# Patient Record
Sex: Female | Born: 1955 | Race: White | Hispanic: No | Smoking: Never smoker
Health system: Southern US, Community
[De-identification: ages and names within clinical notes are randomized; demographics above are authoritative.]

## PROBLEM LIST (undated history)

## (undated) DIAGNOSIS — C3490 Malignant neoplasm of unspecified part of unspecified bronchus or lung: Secondary | ICD-10-CM

## (undated) DIAGNOSIS — C801 Malignant (primary) neoplasm, unspecified: Secondary | ICD-10-CM

---

## 2021-07-12 ENCOUNTER — Encounter (HOSPITAL_COMMUNITY): Payer: Self-pay | Admitting: Emergency Medicine

## 2021-07-12 ENCOUNTER — Inpatient Hospital Stay (HOSPITAL_COMMUNITY)
Admission: EM | Admit: 2021-07-12 | Discharge: 2021-07-24 | DRG: 871 | Disposition: A | Discharge status: Home Health | Payer: Medicare PPO | Attending: Internal Medicine | Admitting: Internal Medicine

## 2021-07-12 ENCOUNTER — Emergency Department (HOSPITAL_COMMUNITY): Payer: Medicare PPO

## 2021-07-12 ENCOUNTER — Other Ambulatory Visit: Payer: Self-pay

## 2021-07-12 DIAGNOSIS — Z90710 Acquired absence of both cervix and uterus: Secondary | ICD-10-CM

## 2021-07-12 DIAGNOSIS — T451X5A Adverse effect of antineoplastic and immunosuppressive drugs, initial encounter: Secondary | ICD-10-CM | POA: Diagnosis present

## 2021-07-12 DIAGNOSIS — Y842 Radiological procedure and radiotherapy as the cause of abnormal reaction of the patient, or of later complication, without mention of misadventure at the time of the procedure: Secondary | ICD-10-CM | POA: Diagnosis present

## 2021-07-12 DIAGNOSIS — J189 Pneumonia, unspecified organism: Secondary | ICD-10-CM | POA: Diagnosis present

## 2021-07-12 DIAGNOSIS — R627 Adult failure to thrive: Secondary | ICD-10-CM | POA: Diagnosis present

## 2021-07-12 DIAGNOSIS — C7951 Secondary malignant neoplasm of bone: Secondary | ICD-10-CM | POA: Diagnosis present

## 2021-07-12 DIAGNOSIS — K449 Diaphragmatic hernia without obstruction or gangrene: Secondary | ICD-10-CM | POA: Diagnosis present

## 2021-07-12 DIAGNOSIS — R0603 Acute respiratory distress: Secondary | ICD-10-CM

## 2021-07-12 DIAGNOSIS — J811 Chronic pulmonary edema: Secondary | ICD-10-CM | POA: Diagnosis present

## 2021-07-12 DIAGNOSIS — R0602 Shortness of breath: Secondary | ICD-10-CM | POA: Diagnosis not present

## 2021-07-12 DIAGNOSIS — R64 Cachexia: Secondary | ICD-10-CM | POA: Diagnosis present

## 2021-07-12 DIAGNOSIS — J810 Acute pulmonary edema: Secondary | ICD-10-CM | POA: Diagnosis present

## 2021-07-12 DIAGNOSIS — R000 Tachycardia, unspecified: Secondary | ICD-10-CM

## 2021-07-12 DIAGNOSIS — E119 Type 2 diabetes mellitus without complications: Secondary | ICD-10-CM | POA: Diagnosis present

## 2021-07-12 DIAGNOSIS — D630 Anemia in neoplastic disease: Secondary | ICD-10-CM | POA: Diagnosis present

## 2021-07-12 DIAGNOSIS — J441 Chronic obstructive pulmonary disease with (acute) exacerbation: Secondary | ICD-10-CM | POA: Diagnosis present

## 2021-07-12 DIAGNOSIS — J8410 Pulmonary fibrosis, unspecified: Secondary | ICD-10-CM | POA: Diagnosis present

## 2021-07-12 DIAGNOSIS — D6481 Anemia due to antineoplastic chemotherapy: Secondary | ICD-10-CM | POA: Diagnosis present

## 2021-07-12 DIAGNOSIS — Z9981 Dependence on supplemental oxygen: Secondary | ICD-10-CM

## 2021-07-12 DIAGNOSIS — I10 Essential (primary) hypertension: Secondary | ICD-10-CM | POA: Diagnosis present

## 2021-07-12 DIAGNOSIS — J9621 Acute and chronic respiratory failure with hypoxia: Secondary | ICD-10-CM | POA: Diagnosis present

## 2021-07-12 DIAGNOSIS — J440 Chronic obstructive pulmonary disease with acute lower respiratory infection: Secondary | ICD-10-CM | POA: Diagnosis present

## 2021-07-12 DIAGNOSIS — Z882 Allergy status to sulfonamides status: Secondary | ICD-10-CM

## 2021-07-12 DIAGNOSIS — Z20822 Contact with and (suspected) exposure to covid-19: Secondary | ICD-10-CM | POA: Diagnosis present

## 2021-07-12 DIAGNOSIS — E43 Unspecified severe protein-calorie malnutrition: Secondary | ICD-10-CM | POA: Diagnosis present

## 2021-07-12 DIAGNOSIS — E876 Hypokalemia: Secondary | ICD-10-CM | POA: Diagnosis present

## 2021-07-12 DIAGNOSIS — J90 Pleural effusion, not elsewhere classified: Secondary | ICD-10-CM | POA: Diagnosis present

## 2021-07-12 DIAGNOSIS — T50995A Adverse effect of other drugs, medicaments and biological substances, initial encounter: Secondary | ICD-10-CM | POA: Diagnosis present

## 2021-07-12 DIAGNOSIS — M8448XA Pathological fracture, other site, initial encounter for fracture: Secondary | ICD-10-CM | POA: Diagnosis present

## 2021-07-12 DIAGNOSIS — Z79899 Other long term (current) drug therapy: Secondary | ICD-10-CM

## 2021-07-12 DIAGNOSIS — R0982 Postnasal drip: Secondary | ICD-10-CM | POA: Diagnosis present

## 2021-07-12 DIAGNOSIS — Z9889 Other specified postprocedural states: Secondary | ICD-10-CM | POA: Diagnosis not present

## 2021-07-12 DIAGNOSIS — D849 Immunodeficiency, unspecified: Secondary | ICD-10-CM | POA: Diagnosis present

## 2021-07-12 DIAGNOSIS — Z923 Personal history of irradiation: Secondary | ICD-10-CM | POA: Diagnosis not present

## 2021-07-12 DIAGNOSIS — R0600 Dyspnea, unspecified: Secondary | ICD-10-CM

## 2021-07-12 DIAGNOSIS — K5909 Other constipation: Secondary | ICD-10-CM | POA: Diagnosis present

## 2021-07-12 DIAGNOSIS — C7931 Secondary malignant neoplasm of brain: Secondary | ICD-10-CM | POA: Diagnosis present

## 2021-07-12 DIAGNOSIS — R6520 Severe sepsis without septic shock: Secondary | ICD-10-CM | POA: Diagnosis not present

## 2021-07-12 DIAGNOSIS — G893 Neoplasm related pain (acute) (chronic): Secondary | ICD-10-CM | POA: Diagnosis present

## 2021-07-12 DIAGNOSIS — F32A Depression, unspecified: Secondary | ICD-10-CM | POA: Diagnosis present

## 2021-07-12 DIAGNOSIS — E872 Acidosis: Secondary | ICD-10-CM | POA: Diagnosis present

## 2021-07-12 DIAGNOSIS — Z6826 Body mass index (BMI) 26.0-26.9, adult: Secondary | ICD-10-CM

## 2021-07-12 DIAGNOSIS — A419 Sepsis, unspecified organism: Secondary | ICD-10-CM | POA: Diagnosis not present

## 2021-07-12 DIAGNOSIS — J9601 Acute respiratory failure with hypoxia: Secondary | ICD-10-CM

## 2021-07-12 DIAGNOSIS — C3490 Malignant neoplasm of unspecified part of unspecified bronchus or lung: Secondary | ICD-10-CM | POA: Diagnosis present

## 2021-07-12 DIAGNOSIS — R509 Fever, unspecified: Secondary | ICD-10-CM

## 2021-07-12 DIAGNOSIS — Z885 Allergy status to narcotic agent status: Secondary | ICD-10-CM

## 2021-07-12 DIAGNOSIS — Y92009 Unspecified place in unspecified non-institutional (private) residence as the place of occurrence of the external cause: Secondary | ICD-10-CM | POA: Diagnosis not present

## 2021-07-12 HISTORY — DX: Malignant (primary) neoplasm, unspecified: C80.1

## 2021-07-12 HISTORY — DX: Malignant neoplasm of unspecified part of unspecified bronchus or lung: C34.90

## 2021-07-12 LAB — COMPREHENSIVE METABOLIC PANEL
ALT: 10 U/L (ref 0–44)
AST: 15 U/L (ref 15–41)
Albumin: 2.6 g/dL — ABNORMAL LOW (ref 3.5–5.0)
Alkaline Phosphatase: 67 U/L (ref 38–126)
Anion gap: 8 (ref 5–15)
BUN: 15 mg/dL (ref 8–23)
CO2: 28 mmol/L (ref 22–32)
Calcium: 9.6 mg/dL (ref 8.9–10.3)
Chloride: 101 mmol/L (ref 98–111)
Creatinine, Ser: 1 mg/dL (ref 0.44–1.00)
GFR, Estimated: >60 mL/min (ref >60)
Glucose, Bld: 168 mg/dL — ABNORMAL HIGH (ref 70–99)
Potassium: 3.6 mmol/L (ref 3.5–5.1)
Sodium: 137 mmol/L (ref 135–145)
Total Bilirubin: 0.3 mg/dL (ref 0.3–1.2)
Total Protein: 5.4 g/dL — ABNORMAL LOW (ref 6.5–8.1)

## 2021-07-12 LAB — CBC WITH DIFFERENTIAL/PLATELET
Abs Immature Granulocytes: 0.55 10*3/uL — ABNORMAL HIGH (ref 0.00–0.07)
Basophils Absolute: 0.1 10*3/uL (ref 0.0–0.1)
Basophils Relative: 0 %
Eosinophils Absolute: 0 10*3/uL (ref 0.0–0.5)
Eosinophils Relative: 0 %
HCT: 31.2 % — ABNORMAL LOW (ref 36.0–46.0)
Hemoglobin: 9.8 g/dL — ABNORMAL LOW (ref 12.0–15.0)
Immature Granulocytes: 3 %
Lymphocytes Relative: 2 %
Lymphs Abs: 0.4 10*3/uL — ABNORMAL LOW (ref 0.7–4.0)
MCH: 30.7 pg (ref 26.0–34.0)
MCHC: 31.4 g/dL (ref 30.0–36.0)
MCV: 97.8 fL (ref 80.0–100.0)
Monocytes Absolute: 0.9 10*3/uL (ref 0.1–1.0)
Monocytes Relative: 5 %
Neutro Abs: 16.5 10*3/uL — ABNORMAL HIGH (ref 1.7–7.7)
Neutrophils Relative %: 90 %
Platelets: 173 10*3/uL (ref 150–400)
RBC: 3.19 MIL/uL — ABNORMAL LOW (ref 3.87–5.11)
RDW: 16.1 % — ABNORMAL HIGH (ref 11.5–15.5)
WBC: 18.5 10*3/uL — ABNORMAL HIGH (ref 4.0–10.5)
nRBC: 0 % (ref 0.0–0.2)

## 2021-07-12 LAB — URINALYSIS, ROUTINE W REFLEX MICROSCOPIC
Bilirubin Urine: NEGATIVE
Glucose, UA: NEGATIVE mg/dL
Hgb urine dipstick: NEGATIVE
Ketones, ur: NEGATIVE mg/dL
Leukocytes,Ua: NEGATIVE
Nitrite: NEGATIVE
Protein, ur: NEGATIVE mg/dL
Specific Gravity, Urine: <1.005 — ABNORMAL LOW (ref 1.005–1.030)
pH: 6 (ref 5.0–8.0)

## 2021-07-12 LAB — APTT: aPTT: 98 seconds — ABNORMAL HIGH (ref 24–36)

## 2021-07-12 LAB — RESP PANEL BY RT-PCR (FLU A&B, COVID) ARPGX2
Influenza A by PCR: NEGATIVE
Influenza B by PCR: NEGATIVE
SARS Coronavirus 2 by RT PCR: NEGATIVE

## 2021-07-12 LAB — CBG MONITORING, ED
Glucose-Capillary: 133 mg/dL — ABNORMAL HIGH (ref 70–99)
Glucose-Capillary: 85 mg/dL (ref 70–99)

## 2021-07-12 LAB — PROTIME-INR
INR: 1.1 (ref 0.8–1.2)
Prothrombin Time: 13.9 seconds (ref 11.4–15.2)

## 2021-07-12 LAB — TROPONIN I (HIGH SENSITIVITY)
Troponin I (High Sensitivity): 7 ng/L (ref <18)
Troponin I (High Sensitivity): 8 ng/L (ref <18)

## 2021-07-12 LAB — LACTIC ACID, PLASMA
Lactic Acid, Venous: 2.8 mmol/L (ref 0.5–1.9)
Lactic Acid, Venous: 2.6 mmol/L (ref 0.5–1.9)

## 2021-07-12 LAB — GLUCOSE, CAPILLARY: Glucose-Capillary: 106 mg/dL — ABNORMAL HIGH (ref 70–99)

## 2021-07-12 MED ORDER — SODIUM CHLORIDE 0.9 % IV BOLUS
500.0000 mL | Freq: Once | INTRAVENOUS | Status: AC
  Administered 2021-07-12: 500 mL via INTRAVENOUS

## 2021-07-12 MED ORDER — IPRATROPIUM-ALBUTEROL 0.5-2.5 (3) MG/3ML IN SOLN
3.0000 mL | Freq: Four times a day (QID) | RESPIRATORY_TRACT | Status: DC | PRN
  Administered 2021-07-13 (×2): 3 mL via RESPIRATORY_TRACT
  Filled 2021-07-12 (×2): qty 3

## 2021-07-12 MED ORDER — CHLORHEXIDINE GLUCONATE CLOTH 2 % EX PADS
6.0000 | MEDICATED_PAD | Freq: Every day | CUTANEOUS | Status: DC
  Administered 2021-07-13 – 2021-07-24 (×12): 6 via TOPICAL

## 2021-07-12 MED ORDER — INSULIN ASPART 100 UNIT/ML IJ SOLN
0.0000 [IU] | Freq: Three times a day (TID) | INTRAMUSCULAR | Status: DC
  Administered 2021-07-14 – 2021-07-20 (×5): 1 [IU] via SUBCUTANEOUS
  Administered 2021-07-21: 3 [IU] via SUBCUTANEOUS
  Administered 2021-07-21: 2 [IU] via SUBCUTANEOUS
  Administered 2021-07-22 – 2021-07-23 (×3): 1 [IU] via SUBCUTANEOUS
  Administered 2021-07-23: 2 [IU] via SUBCUTANEOUS
  Filled 2021-07-12: qty 0.06

## 2021-07-12 MED ORDER — SODIUM CHLORIDE 0.9 % IV SOLN: INTRAVENOUS | Status: DC

## 2021-07-12 MED ORDER — ACETAMINOPHEN 325 MG PO TABS
650.0000 mg | ORAL_TABLET | Freq: Four times a day (QID) | ORAL | Status: DC | PRN
  Administered 2021-07-18 – 2021-07-21 (×3): 650 mg via ORAL
  Filled 2021-07-12 (×3): qty 2

## 2021-07-12 MED ORDER — ONDANSETRON HCL 4 MG PO TABS
4.0000 mg | ORAL_TABLET | Freq: Four times a day (QID) | ORAL | Status: DC | PRN
  Administered 2021-07-22: 4 mg via ORAL
  Filled 2021-07-12: qty 1

## 2021-07-12 MED ORDER — ACETAMINOPHEN 500 MG PO TABS
1000.0000 mg | ORAL_TABLET | Freq: Once | ORAL | Status: AC
  Administered 2021-07-12: 1000 mg via ORAL
  Filled 2021-07-12: qty 2

## 2021-07-12 MED ORDER — CALCIUM CARBONATE ANTACID 500 MG PO CHEW
1.0000 | CHEWABLE_TABLET | Freq: Three times a day (TID) | ORAL | Status: DC
  Administered 2021-07-12 – 2021-07-24 (×30): 200 mg via ORAL
  Filled 2021-07-12 (×33): qty 1

## 2021-07-12 MED ORDER — ONDANSETRON HCL 4 MG/2ML IJ SOLN
4.0000 mg | Freq: Four times a day (QID) | INTRAMUSCULAR | Status: DC | PRN
  Administered 2021-07-12 – 2021-07-22 (×8): 4 mg via INTRAVENOUS
  Filled 2021-07-12 (×8): qty 2

## 2021-07-12 MED ORDER — HYDROMORPHONE HCL 1 MG/ML IJ SOLN
1.0000 mg | Freq: Once | INTRAMUSCULAR | Status: AC
  Administered 2021-07-12: 1 mg via INTRAVENOUS
  Filled 2021-07-12: qty 1

## 2021-07-12 MED ORDER — LACTATED RINGERS IV BOLUS
500.0000 mL | Freq: Once | INTRAVENOUS | Status: AC
  Administered 2021-07-12: 500 mL via INTRAVENOUS

## 2021-07-12 MED ORDER — ACETAMINOPHEN 650 MG RE SUPP: 650.0000 mg | Freq: Four times a day (QID) | RECTAL | Status: DC | PRN

## 2021-07-12 MED ORDER — PANTOPRAZOLE SODIUM 40 MG PO TBEC
40.0000 mg | DELAYED_RELEASE_TABLET | Freq: Every day | ORAL | Status: DC
  Administered 2021-07-12 – 2021-07-24 (×13): 40 mg via ORAL
  Filled 2021-07-12 (×13): qty 1

## 2021-07-12 MED ORDER — FENTANYL CITRATE PF 50 MCG/ML IJ SOSY: 50.0000 ug | PREFILLED_SYRINGE | Freq: Once | INTRAMUSCULAR | Status: DC

## 2021-07-12 MED ORDER — MORPHINE SULFATE (PF) 4 MG/ML IV SOLN
4.0000 mg | Freq: Once | INTRAVENOUS | Status: AC
Start: 2021-07-12 — End: 2021-07-12
  Administered 2021-07-12: 4 mg via INTRAVENOUS
  Filled 2021-07-12: qty 1

## 2021-07-12 MED ORDER — SODIUM CHLORIDE 0.9 % IV SOLN
2.0000 g | Freq: Once | INTRAVENOUS | Status: AC
  Administered 2021-07-12: 2 g via INTRAVENOUS
  Filled 2021-07-12: qty 2

## 2021-07-12 MED ORDER — SODIUM CHLORIDE 0.9 % IV SOLN: 2.0000 g | Freq: Once | INTRAVENOUS | Status: DC

## 2021-07-12 MED ORDER — GUAIFENESIN ER 600 MG PO TB12
600.0000 mg | ORAL_TABLET | Freq: Two times a day (BID) | ORAL | Status: DC
  Administered 2021-07-12 – 2021-07-23 (×24): 600 mg via ORAL
  Filled 2021-07-12 (×25): qty 1

## 2021-07-12 MED ORDER — SODIUM CHLORIDE 0.9% FLUSH
10.0000 mL | INTRAVENOUS | Status: DC | PRN
  Administered 2021-07-19: 10 mL

## 2021-07-12 MED ORDER — LIDOCAINE-PRILOCAINE 2.5-2.5 % EX CREA
TOPICAL_CREAM | Freq: Once | CUTANEOUS | Status: AC
  Filled 2021-07-12: qty 5

## 2021-07-12 MED ORDER — IOHEXOL 350 MG/ML SOLN
75.0000 mL | Freq: Once | INTRAVENOUS | Status: AC | PRN
  Administered 2021-07-12: 75 mL via INTRAVENOUS

## 2021-07-12 MED ORDER — ONDANSETRON HCL 4 MG/2ML IJ SOLN
4.0000 mg | Freq: Once | INTRAMUSCULAR | Status: AC
  Administered 2021-07-12: 4 mg via INTRAVENOUS
  Filled 2021-07-12: qty 2

## 2021-07-12 MED ORDER — SODIUM CHLORIDE 0.9 % IV SOLN
2.0000 g | Freq: Two times a day (BID) | INTRAVENOUS | Status: DC
  Administered 2021-07-12 – 2021-07-14 (×4): 2 g via INTRAVENOUS
  Filled 2021-07-12 (×5): qty 2

## 2021-07-12 MED ORDER — LACTATED RINGERS IV BOLUS
1000.0000 mL | Freq: Once | INTRAVENOUS | Status: AC
  Administered 2021-07-12: 1000 mL via INTRAVENOUS

## 2021-07-12 MED ORDER — KETOROLAC TROMETHAMINE 15 MG/ML IJ SOLN
15.0000 mg | Freq: Four times a day (QID) | INTRAMUSCULAR | Status: AC | PRN
  Administered 2021-07-12 – 2021-07-15 (×4): 15 mg via INTRAVENOUS
  Filled 2021-07-12 (×4): qty 1

## 2021-07-12 NOTE — Progress Notes (Signed)
Pharmacy Antibiotic Note  Kelli Acosta is a 65 y.o. female admitted on 07/12/2021 with increasing ShOB over several days with new chills/fever.  Pharmacy has been consulted for Cefepime dosing.  Afeb, WBC 18.5, SCr 1.0 Chemo 8/29, PAC noted  Plan: Cefepime 2gm q12 for tx of PNA  Height: 5\' 5"  (165.1 cm) Weight: 71.2 kg (157 lb) IBW/kg (Calculated) : 57  Temp (24hrs), Avg:98.5 F (36.9 C), Min:98.4 F (36.9 C), Max:98.5 F (36.9 C)  Recent Labs  Lab 07/12/21 0628  WBC 18.5*  CREATININE 1.00  LATICACIDVEN 2.8*    Estimated Creatinine Clearance: 56.3 mL/min (by C-G formula based on SCr of 1 mg/dL).    Allergies  Allergen Reactions   Codeine Hives and Itching   Sulfa Antibiotics     Antimicrobials this admission: 9/2 Cefepime >>   Dose adjustments this admission:  Microbiology results: 9/2 BCx: sent HIV Ab ordered  Thank you for allowing pharmacy to be a part of this patient's care.  Minda Ditto PharmD 07/12/2021 10:35 AM

## 2021-07-12 NOTE — ED Provider Notes (Signed)
Assumed care of patient at 7 AM.  Here with fever.  History of lung cancer currently on chemotherapy.  Last chemo infusion about a week ago.  Has had increased shortness of breath.  Had a fever this morning to 100.5.  Sepsis work-up was initiated prior to my evaluation.  She does have a port.  Broad-spectrum IV antibiotics have been given.  She is on her home oxygen of 3 L.  She was tachycardic upon arrival but heart rate is improving.  Blood pressures have been stable.  Chest x-ray concerning for pneumonia.  Lactic acid of 2.8.  White count of 18.  Troponin within normal limits.  We will get a CT scan of her chest to further evaluate infection and rule out PE but overall suspect infectious process.  Will need admission for further sepsis care.  Hemodynamically stable throughout my care.  Will admit to hospitalist.  This chart was dictated using voice recognition software.  Despite best efforts to proofread,  errors can occur which can change the documentation meaning.    Lennice Sites, DO 07/12/21 [phone]

## 2021-07-12 NOTE — Sepsis Progress Note (Signed)
Code Sepsis initiated @ 1444 AM, [place] following.

## 2021-07-12 NOTE — ED Triage Notes (Signed)
Patient arrives with daughter. Patient is a cancer patient in [place]. Patient began vomiting last night. Throughout the night, patient began wheezing and having worsening shortness of breath.

## 2021-07-12 NOTE — ED Provider Notes (Signed)
[facility] Emergency Department Provider Note MRN:  [id number]  Arrival date & time: 07/12/21     Chief Complaint   Shortness of Breath and Fever   History of Present Illness   Kelli Acosta is a 65 y.o. year-old female with a history of lung cancer presenting to the ED with chief complaint of shortness of breath.  Patient is currently on chemotherapy, last infusion on the 29th.  Had a fever up to 100.5 this morning.  Increased shortness of breath, recently had a chest x-ray that showed some fluid around the lungs.  Endorsing some mild chest pain.  Endorsing some low back pain which is chronic.  Endorsing nausea and multiple episodes of vomiting as well.  No abdominal pain, no diarrhea or constipation.  Symptoms are mild to moderate, constant, no exacerbating or alleviating factors  Review of Systems  A complete 10 system review of systems was obtained and all systems are negative except as noted in the HPI and PMH.   Patient's Health History    Past Medical History:  Diagnosis Date   Cancer [facility])    Lung cancer, primary, with metastasis from lung to other site [facility])       No family history on file.  Social History   Socioeconomic History   Marital status: Widowed    Spouse name: Not on file   Number of children: Not on file   Years of education: Not on file   Highest education level: Not on file  Occupational History   Not on file  Tobacco Use   Smoking status: Never   Smokeless tobacco: Never  Substance and Sexual Activity   Alcohol use: Never   Drug use: Never   Sexual activity: Not on file  Other Topics Concern   Not on file  Social History Narrative   Not on file   Social Determinants of Health   Financial Resource Strain: Not on file  Food Insecurity: Not on file  Transportation Needs: Not on file  Physical Activity: Not on file  Stress: Not on file  Social Connections: Not on file  Intimate Partner Violence: Not on file      Physical Exam   Vitals:   07/12/21 0613 07/12/21 0645  BP: (!) 153/78 101/67  Pulse: (!) 124 (!) 113  Resp: (!) 22 20  Temp: 98.4 F (36.9 C)   SpO2: 96% 100%    CONSTITUTIONAL: Well-appearing, NAD NEURO:  Alert and oriented x 3, no focal deficits EYES:  eyes equal and reactive ENT/NECK:  no LAD, no JVD CARDIO: Tachycardic rate, well-perfused, normal S1 and S2 PULM: Faint wheeze, mildly tachypneic GI/GU:  normal bowel sounds, non-distended, non-tender MSK/SPINE:  No gross deformities, no edema SKIN:  no rash, atraumatic PSYCH:  Appropriate speech and behavior  *Additional and/or pertinent findings included in MDM below  Diagnostic and Interventional Summary    EKG Interpretation  Date/Time:  Friday July 12 2021 07:09:09 EDT Ventricular Rate:  109 PR Interval:  154 QRS Duration: 84 QT Interval:  323 QTC Calculation: 435 R Axis:   68 Text Interpretation: Sinus tachycardia Anterior infarct, old Confirmed by Gerlene Fee [id number]) on 07/12/2021 7:19:03 AM       Labs Reviewed  CBC WITH DIFFERENTIAL/PLATELET - Abnormal; Notable for the following components:      Result Value   WBC 18.5 (*)    RBC 3.19 (*)    Hemoglobin 9.8 (*)    HCT 31.2 (*)    RDW 16.1 (*)  All other components within normal limits  CULTURE, BLOOD (ROUTINE X 2)  CULTURE, BLOOD (ROUTINE X 2)  RESP PANEL BY RT-PCR (FLU A&B, COVID) ARPGX2  LACTIC ACID, PLASMA  LACTIC ACID, PLASMA  COMPREHENSIVE METABOLIC PANEL  PROTIME-INR  APTT  URINALYSIS, ROUTINE W REFLEX MICROSCOPIC  TROPONIN I (HIGH SENSITIVITY)    DG Chest 2 View    (Results Pending)    Medications  fentaNYL (SUBLIMAZE) injection 50 mcg (50 mcg Intravenous Patient Refused/Not Given 07/12/21 0702)  ceFEPIme (MAXIPIME) 2 g in sodium chloride 0.9 % 100 mL IVPB (2 g Intravenous New Bag/Given 07/12/21 0704)  acetaminophen (TYLENOL) tablet 1,000 mg (has no administration in time range)  lidocaine-prilocaine (EMLA) cream ( Topical Given  by Other 07/12/21 0618)  lactated ringers bolus 1,000 mL (1,000 mLs Intravenous New Bag/Given 07/12/21 0656)  ondansetron (ZOFRAN) injection 4 mg (4 mg Intravenous Given 07/12/21 0654)     Procedures  /  Critical Care .Critical Care  Date/Time: 07/12/2021 6:45 AM Performed by: Maudie Flakes, MD Authorized by: Maudie Flakes, MD   Critical care provider statement:    Critical care time (minutes):  32   Critical care was necessary to treat or prevent imminent or life-threatening deterioration of the following conditions: Concern for sepsis or neutropenic fever.   Critical care was time spent personally by me on the following activities:  Discussions with consultants, evaluation of patient's response to treatment, examination of patient, ordering and performing treatments and interventions, ordering and review of laboratory studies, ordering and review of radiographic studies, pulse oximetry, re-evaluation of patient's condition, obtaining history from patient or surrogate and review of old charts  ED Course and Medical Decision Making  I have reviewed the triage vital signs, the nursing notes, and pertinent available records from the EMR.  Listed above are laboratory and imaging tests that I personally ordered, reviewed, and interpreted and then considered in my medical decision making (see below for details).  Concern for possible neutropenic fever or sepsis, worsening pleural effusion, also considering PE.  Awaiting initial labs, chest x-ray, would consider CTA.     Given patient's tachycardia and tachypnea code sepsis was initiated, providing fluid bolus, cefepime.  Signed out to oncoming provider at shift change.  Anticipating admission.  Barth Kirks. Sedonia Small, [place] [email or web address]  Final Clinical Impressions(s) / ED Diagnoses     ICD-10-CM   1. Fever, unspecified fever cause  R50.9     2. Tachycardia  R00.0     3. Shortness  of breath  R06.02       ED Discharge Orders     None        Discharge Instructions Discussed with and Provided to Patient:   Discharge Instructions   None       Maudie Flakes, MD 07/12/21 [phone]

## 2021-07-12 NOTE — H&P (Signed)
History and Physical    Kelli Acosta IRC:[id number] DOB: May 20, 1956 DOA: 07/12/2021  PCP: System, Provider Not In  Patient coming from: Home  Chief Complaint: dyspnea  HPI: Kelli Acosta is a 65 y.o. 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 Presenting with dyspnea. She reports that she has had increasing dyspnea over the last several days. She has required increased use of her inhalers and nebs. She has had some cough as well. Last night she had severe chills, N/V and fever. Her daughter became concerned and called her cancer center. It was recommended that she be brought to the ED for evaluation. Of note, she had chemo on 07/08/21. Otherwise, she denies any other aggravating or alleviating factors.    ED Course: She was found to be tachypneic, tachycardic. She had elevated WBC and lactic acids. CXR showed PNA. CTA chest was ordered. She was started on fluids and abx. TRH was called for admission.   Review of Systems:  Denies CP, palpitations, lightheadedness, dizziness, diarrhea, sick contacts. Review of systems is otherwise negative for all not mentioned in HPI.   PMHx Past Medical History:  Diagnosis Date   Cancer [facility])    Lung cancer, primary, with metastasis from lung to other site [facility])   DM2 - diet controlled COPD HTN  PSHx Hysterectomy Cyberknife x 2 Hiatal hernal repair kyphoplasty  SocHx  reports that she has never smoked. She has never used smokeless tobacco. She reports that she does not drink alcohol and does not use drugs.  Allergies  Allergen Reactions   Codeine Hives and Itching   Sulfa Antibiotics     FamHx Reviewed, non-contributory  Prior to Admission medications   Not on File    Physical Exam: Vitals:   07/12/21 0651 07/12/21 0735 07/12/21 0800 07/12/21 0830  BP:  121/61 (!) 103/57 (!) 104/45  Pulse:  (!) 109 (!) 109 (!) 107  Resp:  (!) 26 (!) 22 20  Temp:      TempSrc:      SpO2:  96% 100%  98%  Weight: 71.2 kg     Height: 5\' 5"  (1.651 m)       General: 65 y.o. female resting in bed in NAD Eyes: PERRL, normal sclera ENMT: Nares patent w/o discharge, orophaynx clear, dentition normal, ears w/o discharge/lesions/ulcers Neck: Supple, trachea midline Cardiovascular: tachy, +S1, S2, no m/g/r, equal pulses throughout Respiratory: decreased at bases, soft scattered rhonchi, slight increased WOB on 3L [place] GI: BS+, NDNT, no masses noted, no organomegaly noted MSK: No e/c/c Skin: No rashes, bruises, ulcerations noted Neuro: A&O x 3, no focal deficits Psyc: Appropriate interaction and affect, calm/cooperative  Labs on Admission: I have personally reviewed following labs and imaging studies  CBC: Recent Labs  Lab 07/12/21 0628  WBC 18.5*  NEUTROABS 16.5*  HGB 9.8*  HCT 31.2*  MCV 97.8  PLT 510   Basic Metabolic Panel: Recent Labs  Lab 07/12/21 0628  NA 137  K 3.6  CL 101  CO2 28  GLUCOSE 168*  BUN 15  CREATININE 1.00  CALCIUM 9.6   GFR: Estimated Creatinine Clearance: 56.3 mL/min (by C-G formula based on SCr of 1 mg/dL). Liver Function Tests: Recent Labs  Lab 07/12/21 0628  AST 15  ALT 10  ALKPHOS 67  BILITOT 0.3  PROT 5.4*  ALBUMIN 2.6*   No results for input(s): LIPASE, AMYLASE in the last 168 hours. No results for input(s): AMMONIA in the last  168 hours. Coagulation Profile: Recent Labs  Lab 07/12/21 0628  INR 1.1   Cardiac Enzymes: No results for input(s): CKTOTAL, CKMB, CKMBINDEX, TROPONINI in the last 168 hours. BNP (last 3 results) No results for input(s): PROBNP in the last 8760 hours. HbA1C: No results for input(s): HGBA1C in the last 72 hours. CBG: No results for input(s): GLUCAP in the last 168 hours. Lipid Profile: No results for input(s): CHOL, HDL, LDLCALC, TRIG, CHOLHDL, LDLDIRECT in the last 72 hours. Thyroid Function Tests: No results for input(s): TSH, T4TOTAL, FREET4, T3FREE, THYROIDAB in the last 72 hours. Anemia  Panel: No results for input(s): VITAMINB12, FOLATE, FERRITIN, TIBC, IRON, RETICCTPCT in the last 72 hours. Urine analysis: No results found for: COLORURINE, APPEARANCEUR, LABSPEC, [place], GLUCOSEU, HGBUR, BILIRUBINUR, KETONESUR, PROTEINUR, UROBILINOGEN, NITRITE, LEUKOCYTESUR  Radiological Exams on Admission: DG Chest 2 View  Result Date: 07/12/2021 CLINICAL DATA:  Lung cancer with treatment at outside facility. Worsening condition overnight EXAM: CHEST - 2 VIEW COMPARISON:  None. FINDINGS: Bands of airspace type opacity over the left more than right chest with dense ovoid nodule over the left upper chest. Porta catheter from left approach with tip at the right atrium. No visible edema effusion, or pneumothorax. Artifact from EKG leads. IMPRESSION: Extensive chest opacification primarily worrisome for pneumonia, although some of this opacity may be relay the since history of lung cancer. Electronically Signed   By: Monte Fantasia M.D.   On: 07/12/2021 07:49    EKG: Independently reviewed. Sinus tach, no st elevations  Assessment/Plan Dyspnea PNA Sepsis secondary to above     - admit to inpt, progressive     - continue abx, fluids     - check Bld Cx, UCx     - wean O2 as able   Hx of lung cancer w/ mets to brain; currently on chemo     - onco care is in [place]; chemo last week     - ANC is ok     - continue follow up outpt  N/V     - zofran, follow  DM2     - diet controlled     - SSI, DM diet, glucose checks, A1c  Normocytic anemia     - no evidence of bleed     - check iron studies  DVT prophylaxis: SCDs  Code Status: FULL  Family Communication: w/ dtr at bedside  Consults called: None   Status is: Inpatient  Remains inpatient appropriate because:Inpatient level of care appropriate due to severity of illness  Dispo: The patient is from: Home              Anticipated d/c is to: Home              Patient currently is not medically stable to d/c.   Difficult to place  patient No  Time spent coordinating admission: 65 minutes  [facility]  If 7PM-7AM, please contact night-coverage [email or web address]  07/12/2021, 9:54 AM

## 2021-07-12 NOTE — ED Notes (Signed)
Lactic 2.8 reported to A. Curatolo

## 2021-07-13 DIAGNOSIS — R6520 Severe sepsis without septic shock: Secondary | ICD-10-CM | POA: Diagnosis not present

## 2021-07-13 DIAGNOSIS — A419 Sepsis, unspecified organism: Secondary | ICD-10-CM | POA: Diagnosis not present

## 2021-07-13 DIAGNOSIS — J9601 Acute respiratory failure with hypoxia: Secondary | ICD-10-CM | POA: Diagnosis not present

## 2021-07-13 LAB — PROTIME-INR
INR: 1.2 (ref 0.8–1.2)
Prothrombin Time: 15.5 seconds — ABNORMAL HIGH (ref 11.4–15.2)

## 2021-07-13 LAB — CBC
HCT: 27.9 % — ABNORMAL LOW (ref 36.0–46.0)
Hemoglobin: 8.6 g/dL — ABNORMAL LOW (ref 12.0–15.0)
MCH: 30.6 pg (ref 26.0–34.0)
MCHC: 30.8 g/dL (ref 30.0–36.0)
MCV: 99.3 fL (ref 80.0–100.0)
Platelets: 162 10*3/uL (ref 150–400)
RBC: 2.81 MIL/uL — ABNORMAL LOW (ref 3.87–5.11)
RDW: 16.5 % — ABNORMAL HIGH (ref 11.5–15.5)
WBC: 16.4 10*3/uL — ABNORMAL HIGH (ref 4.0–10.5)
nRBC: 0 % (ref 0.0–0.2)

## 2021-07-13 LAB — HIV ANTIBODY (ROUTINE TESTING W REFLEX): HIV Screen 4th Generation wRfx: NONREACTIVE

## 2021-07-13 LAB — COMPREHENSIVE METABOLIC PANEL
ALT: 9 U/L (ref 0–44)
AST: 11 U/L — ABNORMAL LOW (ref 15–41)
Albumin: 2.4 g/dL — ABNORMAL LOW (ref 3.5–5.0)
Alkaline Phosphatase: 68 U/L (ref 38–126)
Anion gap: 4 — ABNORMAL LOW (ref 5–15)
BUN: 18 mg/dL (ref 8–23)
CO2: 29 mmol/L (ref 22–32)
Calcium: 8.9 mg/dL (ref 8.9–10.3)
Chloride: 107 mmol/L (ref 98–111)
Creatinine, Ser: 1.18 mg/dL — ABNORMAL HIGH (ref 0.44–1.00)
GFR, Estimated: 52 mL/min — ABNORMAL LOW (ref >60)
Glucose, Bld: 116 mg/dL — ABNORMAL HIGH (ref 70–99)
Potassium: 4.1 mmol/L (ref 3.5–5.1)
Sodium: 140 mmol/L (ref 135–145)
Total Bilirubin: 0.7 mg/dL (ref 0.3–1.2)
Total Protein: 5.2 g/dL — ABNORMAL LOW (ref 6.5–8.1)

## 2021-07-13 LAB — GLUCOSE, CAPILLARY
Glucose-Capillary: 109 mg/dL — ABNORMAL HIGH (ref 70–99)
Glucose-Capillary: 143 mg/dL — ABNORMAL HIGH (ref 70–99)
Glucose-Capillary: 143 mg/dL — ABNORMAL HIGH (ref 70–99)
Glucose-Capillary: 257 mg/dL — ABNORMAL HIGH (ref 70–99)

## 2021-07-13 LAB — MRSA NEXT GEN BY PCR, NASAL: MRSA by PCR Next Gen: NOT DETECTED

## 2021-07-13 LAB — HEMOGLOBIN A1C
Hgb A1c MFr Bld: 5.2 % (ref 4.8–5.6)
Mean Plasma Glucose: 102.54 mg/dL

## 2021-07-13 LAB — CORTISOL-AM, BLOOD: Cortisol - AM: 17.1 ug/dL (ref 6.7–22.6)

## 2021-07-13 LAB — STREP PNEUMONIAE URINARY ANTIGEN: Strep Pneumo Urinary Antigen: NEGATIVE

## 2021-07-13 LAB — LACTIC ACID, PLASMA: Lactic Acid, Venous: 1.1 mmol/L (ref 0.5–1.9)

## 2021-07-13 MED ORDER — VITAMIN D 25 MCG (1000 UNIT) PO TABS
1000.0000 [IU] | ORAL_TABLET | Freq: Every day | ORAL | Status: DC
  Administered 2021-07-13 – 2021-07-24 (×12): 1000 [IU] via ORAL
  Filled 2021-07-13 (×12): qty 1

## 2021-07-13 MED ORDER — ZOLPIDEM TARTRATE 5 MG PO TABS
5.0000 mg | ORAL_TABLET | Freq: Every evening | ORAL | Status: DC | PRN
  Administered 2021-07-13 – 2021-07-23 (×11): 5 mg via ORAL
  Filled 2021-07-13 (×11): qty 1

## 2021-07-13 MED ORDER — PROCHLORPERAZINE MALEATE 10 MG PO TABS: 10.0000 mg | ORAL_TABLET | Freq: Four times a day (QID) | ORAL | Status: DC | PRN

## 2021-07-13 MED ORDER — HYDROXYZINE HCL 25 MG PO TABS: 25.0000 mg | ORAL_TABLET | Freq: Three times a day (TID) | ORAL | Status: DC | PRN

## 2021-07-13 MED ORDER — ARFORMOTEROL TARTRATE 15 MCG/2ML IN NEBU: 15.0000 ug | INHALATION_SOLUTION | Freq: Two times a day (BID) | RESPIRATORY_TRACT | Status: DC

## 2021-07-13 MED ORDER — FOLIC ACID 1 MG PO TABS
1.0000 mg | ORAL_TABLET | Freq: Two times a day (BID) | ORAL | Status: DC
  Administered 2021-07-13 – 2021-07-24 (×23): 1 mg via ORAL
  Filled 2021-07-13 (×23): qty 1

## 2021-07-13 MED ORDER — DEXMETHYLPHENIDATE HCL 5 MG PO TABS
5.0000 mg | ORAL_TABLET | Freq: Two times a day (BID) | ORAL | Status: DC
  Administered 2021-07-16 – 2021-07-24 (×14): 5 mg via ORAL
  Filled 2021-07-13 (×15): qty 1

## 2021-07-13 MED ORDER — VITAMIN E 180 MG (400 UNIT) PO CAPS
400.0000 [IU] | ORAL_CAPSULE | Freq: Every day | ORAL | Status: DC
  Administered 2021-07-13 – 2021-07-24 (×11): 400 [IU] via ORAL
  Filled 2021-07-13 (×3): qty 1
  Filled 2021-07-13: qty 4
  Filled 2021-07-13: qty 1
  Filled 2021-07-13: qty 4
  Filled 2021-07-13 (×4): qty 1
  Filled 2021-07-13: qty 4
  Filled 2021-07-13: qty 1

## 2021-07-13 MED ORDER — ALPRAZOLAM 1 MG PO TABS
1.0000 mg | ORAL_TABLET | Freq: Two times a day (BID) | ORAL | Status: DC | PRN
  Administered 2021-07-13 – 2021-07-17 (×5): 1 mg via ORAL
  Filled 2021-07-13 (×5): qty 1

## 2021-07-13 MED ORDER — BUDESONIDE 0.25 MG/2ML IN SUSP: 0.2500 mg | Freq: Two times a day (BID) | RESPIRATORY_TRACT | Status: DC

## 2021-07-13 MED ORDER — VANCOMYCIN HCL IN DEXTROSE 1-5 GM/200ML-% IV SOLN: 1000.0000 mg | INTRAVENOUS | Status: DC

## 2021-07-13 MED ORDER — BUDESON-GLYCOPYRROL-FORMOTEROL 160-9-4.8 MCG/ACT IN AERO
2.0000 | INHALATION_SPRAY | Freq: Two times a day (BID) | RESPIRATORY_TRACT | Status: DC
  Administered 2021-07-13 – 2021-07-23 (×13): 2 via RESPIRATORY_TRACT

## 2021-07-13 MED ORDER — DIAZEPAM 5 MG PO TABS: 10.0000 mg | ORAL_TABLET | Freq: Every day | ORAL | Status: DC | PRN

## 2021-07-13 MED ORDER — GUAIFENESIN 200 MG PO TABS
200.0000 mg | ORAL_TABLET | ORAL | Status: DC | PRN
  Administered 2021-07-16 – 2021-07-22 (×2): 200 mg via ORAL
  Filled 2021-07-13 (×3): qty 1

## 2021-07-13 MED ORDER — UMECLIDINIUM BROMIDE 62.5 MCG/INH IN AEPB
1.0000 | INHALATION_SPRAY | Freq: Every day | RESPIRATORY_TRACT | Status: DC
  Filled 2021-07-13: qty 7

## 2021-07-13 MED ORDER — VANCOMYCIN HCL 1500 MG/300ML IV SOLN
1500.0000 mg | Freq: Once | INTRAVENOUS | Status: AC
  Administered 2021-07-13: 1500 mg via INTRAVENOUS
  Filled 2021-07-13: qty 300

## 2021-07-13 MED ORDER — ENOXAPARIN SODIUM 40 MG/0.4ML IJ SOSY
40.0000 mg | PREFILLED_SYRINGE | INTRAMUSCULAR | Status: DC
  Administered 2021-07-13 – 2021-07-24 (×12): 40 mg via SUBCUTANEOUS
  Filled 2021-07-13 (×12): qty 0.4

## 2021-07-13 MED ORDER — SERTRALINE HCL 100 MG PO TABS
100.0000 mg | ORAL_TABLET | Freq: Every day | ORAL | Status: DC
  Administered 2021-07-13 – 2021-07-24 (×12): 100 mg via ORAL
  Filled 2021-07-13 (×13): qty 1

## 2021-07-13 MED ORDER — ALBUTEROL SULFATE (2.5 MG/3ML) 0.083% IN NEBU
3.0000 mL | INHALATION_SOLUTION | Freq: Four times a day (QID) | RESPIRATORY_TRACT | Status: DC | PRN
  Administered 2021-07-13 – 2021-07-16 (×5): 3 mL via RESPIRATORY_TRACT
  Filled 2021-07-13 (×6): qty 3

## 2021-07-13 MED ORDER — METHYLPREDNISOLONE SODIUM SUCC 40 MG IJ SOLR
40.0000 mg | Freq: Two times a day (BID) | INTRAMUSCULAR | Status: DC
  Administered 2021-07-13 – 2021-07-14 (×3): 40 mg via INTRAVENOUS
  Filled 2021-07-13 (×3): qty 1

## 2021-07-13 MED ORDER — PANTOPRAZOLE SODIUM 40 MG PO TBEC: 40.0000 mg | DELAYED_RELEASE_TABLET | Freq: Every day | ORAL | Status: DC

## 2021-07-13 MED ORDER — GLUCERNA SHAKE PO LIQD
237.0000 mL | Freq: Three times a day (TID) | ORAL | Status: DC
  Administered 2021-07-13 – 2021-07-18 (×3): 237 mL via ORAL
  Filled 2021-07-13 (×17): qty 237

## 2021-07-13 MED ORDER — OXYCODONE HCL 5 MG PO TABS
10.0000 mg | ORAL_TABLET | Freq: Four times a day (QID) | ORAL | Status: DC | PRN
  Administered 2021-07-13 – 2021-07-23 (×33): 10 mg via ORAL
  Filled 2021-07-13 (×33): qty 2

## 2021-07-13 MED ORDER — ADULT MULTIVITAMIN W/MINERALS CH
1.0000 | ORAL_TABLET | Freq: Every day | ORAL | Status: DC
  Administered 2021-07-13 – 2021-07-24 (×12): 1 via ORAL
  Filled 2021-07-13 (×13): qty 1

## 2021-07-13 MED ORDER — BUDESON-GLYCOPYRROL-FORMOTEROL 160-9-4.8 MCG/ACT IN AERO: 2.0000 | INHALATION_SPRAY | Freq: Two times a day (BID) | RESPIRATORY_TRACT | Status: DC

## 2021-07-13 NOTE — Progress Notes (Signed)
Initial Nutrition Assessment  DOCUMENTATION CODES:  Not applicable  INTERVENTION:  -Glucerna Shake po TID, each supplement provides 220 kcal and 10 grams of protein -Continue MVI with minerals daily  NUTRITION DIAGNOSIS:  Inadequate oral intake related to nausea, vomiting as evidenced by per patient/family report.  GOAL:  Patient will meet greater than or equal to 90% of their needs  MONITOR:  PO intake, Supplement acceptance, Labs, Weight trends, I & O's  REASON FOR ASSESSMENT:  Consult Assessment of nutrition requirement/status  ASSESSMENT:  Pt with PMH significant for lung cancer w/ mets to the brain and bone (currently on chemo; receives Oncology care in [place]), type 2 DM, COPD, and HTN admitted with severe sepsis and acute hypoxic respiratory failure 2/2 PNA   Pt reports dyspnea for several days PTA requiring increased use of inhaler. Pt also endorses N/V beginning the night before admission. Pt last had chemo on 8/29.   No weight history available for review. Per Care Everywhere, pt weighed 74.8 kg on 04/15/21 and 70.3 kg on 01/01/21, indicating pt's wt has been relatively stable.   No PO intake documented.   Medications: calcium carbonate TID w/ meals, vitamin D3, folvite, SSI, solu-medrol, mvi with minerals, vitamin E, protonix, IV abx Labs: Cr 1.18 (H) CBGs: 85-143 x24 hours  NUTRITION - FOCUSED PHYSICAL EXAM: Unable to perform at this time as RD is working remotely. Will attempt at follow-up.   Diet Order:   Diet Order             Diet Carb Modified Fluid consistency: Thin; Room service appropriate? Yes  Diet effective now                  EDUCATION NEEDS:  No education needs have been identified at this time  Skin:  Skin Assessment: Reviewed RN Assessment  Last BM:  PTA  Height:  Ht Readings from Last 1 Encounters:  07/12/21 5\' 5"  (1.651 m)   Weight:  Wt Readings from Last 1 Encounters:  07/12/21 71.2 kg   BMI:  Body mass index is 26.13  kg/m.  Estimated Nutritional Needs:  Kcal:  1800-2000 Protein:  90-100 grams Fluid:  >1.8L    Larkin Ina, MS, RD, LDN (she/her/hers) RD pager number and weekend/on-call pager number located in [place].

## 2021-07-13 NOTE — Progress Notes (Signed)
PROGRESS NOTE    Kelli Acosta  BHA:[id number] DOB: 27-Jul-1956 DOA: 07/12/2021 PCP: System, Provider Not In   Chief Complaint  Patient presents with   Shortness of Breath   Fever    Brief Narrative: 65 year old female who is in very poor health with lung cancer with mets to brain, bone, type 2 diabetes, COPD, hypertension who goes to [facility] for her oncology care presented to the ED with dyspnea.  She has had dyspnea for several days needing increased use of inhaler, nebulizer, has had cough and had severe chills nausea vomiting fever night prior to admission, daughter became concerned, called her cancer center recommended to go to the ED.  She had chemo on 8/29. In the ED was tachypneic tachycardic with leukocytosis, lactic acidosis, chest x-ray showed pneumonia and CT was ordered as below she was placed on IV fluids antibiotics and admitted for further management.  UA WBC 0-5, blood culture were sent COVID-19 negative.  CT Angio on admission: no PE but limited assessment of lung bases due to respiratory motion. Multifocal pneumonia perhaps superimposed on post radiation changes in the LUL Large, destructive rib lesion with more confluent airspace disease in the adjacent lung. Based on pattern of disease would correlate with any history of COVID-19 infection or any drug therapy that could predispose this patient to pneumonitis. 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 Diffuse skeletal metastases with expansile sclerotic lesion involving the LEFT posterolateral fourth rib with adjacent lung disease as described.  Findings suggestive of pathologic fracture at the L1 level with changes of cement augmentation at T12. L1 is incompletely imaged. Would also correlate with any new symptoms in this area. Moderately large hiatal hernia  I was able to obtain more history from patient's daughter  Subjective: Seen and examined this morning.  She is on 2 L nasal cannula.   Overnight T-max 99.7, heart rate 100 respiration 20, blood pressure stable 147/70 Blood work shows improving WBC count stable renal function low albumin 2.4  Assessment & Plan:  Severe sepsis POA due to pneumonia-immunocompromised and currently on chemo Multifocal pneumonia on superimposed radiation changes Acute hypoxic respite failure due to pneumonia: CT angio no PE see above.  Present with tachycardia tachypnea leukocytosis and lactic acidosis.  Immunocompromised due to recent chemo.  On cefepime we will add vancomycin follow-up blood culture.  UA unremarkable.  Check urine strep antigen and Legionella antigen.  We will get oncology opinion, add incentive spirometry, bronchodilators and continue supplemental oxygen.  Repeat lactic acid, trend WBC count.  C T1c, N1, M1 NSCLC, adenocarcinoma diagnosed 03/24/2019, on 2L Taxotere getting every 3 weeks last chemo 8/26.  CT CAP stable on 06/28/2021, her oncology planning to repeat in 3 months given new pleural effusions. 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  Recent bilateral pleural effusion 06/28/2021-could be from pulmonary fibrosis versus malignancy this is being followed up.  It was improving on her last chest x-ray, she was on Lasix, will hold.  CT chest this admission no comment will need close follow-up upon discharge.  Acute COPD exacerbation Pulmonary fibrosis Severe dyspnea Chronic bronchitis:  Having diffuse bilateral wheezing suspect acute exacerbation of COPD on  prednisone 10 mg will change to IV steroid for now.  In the setting of #1 has had severe dyspnea had pulmonary follow-up with [facility]  back in June 2022,+6-minute walk test.  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 Nebs.Pulmonary support  Moderately large hiatal hernia, add Protonix  Anemia secondary to chemo: Monitor hemoglobin  Chronic pain due to malignancy on oxycodone 10 mg every 6 hours-resuming home meds  Fatigue/deconditioning: PT OT once able Pathological L1 fracture:  Followed up at [facility] chemo last week 8/29.  ANC currently stable.  Follow-up blood culture continue antibiotics as above.  Pain control supportive care  Nausea and vomiting supportive management Zofran IV fluids  Type 2 diabetes mellitus diet controlled: Continue sliding scale.   Goals of care currently full code prognosis remains to be seen.  We discussed briefly currently at wants to stay full code she will continue to engage in discussion and follow-up with her oncology team.  Low threshold for palliative care consult if any decompensation   Diet Order             Diet Carb Modified Fluid consistency: Thin; Room service appropriate? Yes  Diet effective now                   Patient's Body mass index is 26.13 kg/m. DVT prophylaxis: SCDs Start: 07/12/21 1032. But high risk add lovenox. Code Status:   Code Status: Full Code  Family Communication: plan of care discussed with patient and her daughter at bedside. Status is: Inpatient  Remains inpatient appropriate because:IV treatments appropriate due to intensity of illness or inability to take PO and Inpatient level of care appropriate due to severity of illness  Dispo: The patient is from: Home              Anticipated d/c is to: Home              Patient currently is not medically stable to d/c.   Difficult to place patient No  Unresulted Labs (From admission, onward)     Start     Ordered   07/14/21 7591  Basic metabolic panel  Tomorrow morning,   R       Question:  Specimen collection method   Answer:  IV Team=IV Team collect   07/13/21 1022   07/14/21 0500  CBC  Tomorrow morning,   R       Question:  Specimen collection method  Answer:  IV Team=IV Team collect   07/13/21 1022   07/13/21 1036  MRSA Next Gen by PCR, Nasal  Once,   R        07/13/21 1035   07/13/21 1022  Strep pneumoniae urinary antigen  Once,   R        07/13/21 1022   07/13/21 1022  Legionella Pneumophila Serogp 1 Ur Ag  Once,   R        07/13/21 1022   07/13/21 1020  Lactic acid, plasma  ONCE - STAT,   STAT       Question:  Specimen collection method  Answer:  IV Team=IV Team collect   07/13/21 1022   07/12/21 1032  Hemoglobin A1c  Once,   STAT       Comments: To assess prior glycemic control    07/12/21 1031           Medications reviewed:  Scheduled  Meds:  Budeson-Glycopyrrol-Formoterol  2 puff Inhalation BID   calcium carbonate  1 tablet Oral TID WC   Chlorhexidine Gluconate Cloth  6 each Topical Daily   cholecalciferol  1,000 Units Oral Daily   dexmethylphenidate  5 mg Oral BID   folic acid  1 mg Oral BID   guaiFENesin  600 mg Oral BID   insulin aspart  0-6 Units Subcutaneous TID WC   methylPREDNISolone (SOLU-MEDROL) injection  40 mg Intravenous Q12H   multivitamin  1 capsule Oral Daily   pantoprazole  40 mg Oral Daily   pantoprazole  40 mg Oral Daily   sertraline  100 mg Oral Daily   vitamin E  400 Units Oral Daily   Continuous Infusions:  sodium chloride 100 mL/hr at 07/13/21 0516   ceFEPime (MAXIPIME) IV 2 g (07/13/21 0517)   [START ON 07/14/2021] vancomycin     vancomycin     Consultants:see note  Procedures:see note Antimicrobials: Anti-infectives (From admission, onward)    Start     Dose/Rate Route Frequency Ordered Stop   07/14/21 1200  vancomycin (VANCOCIN) IVPB 1000 mg/200 mL premix        1,000 mg 200 mL/hr over 60 Minutes Intravenous Every 24 hours 07/13/21 1037     07/13/21 1130  vancomycin (VANCOREADY) IVPB 1500 mg/300 mL        1,500 mg 150 mL/hr over 120 Minutes  Intravenous  Once 07/13/21 1036     07/12/21 1800  ceFEPIme (MAXIPIME) 2 g in sodium chloride 0.9 % 100 mL IVPB        2 g 200 mL/hr over 30 Minutes Intravenous Every 12 hours 07/12/21 1049     07/12/21 1045  ceFEPIme (MAXIPIME) 2 g in sodium chloride 0.9 % 100 mL IVPB  Status:  Discontinued        2 g 200 mL/hr over 30 Minutes Intravenous  Once 07/12/21 1031 07/12/21 1033   07/12/21 0645  ceFEPIme (MAXIPIME) 2 g in sodium chloride 0.9 % 100 mL IVPB        2 g 200 mL/hr over 30 Minutes Intravenous  Once 07/12/21 2595 07/12/21 0737      Culture/Microbiology    Component Value Date/Time   SDES  07/12/2021 6387    BLOOD RIGHT ANTECUBITAL Performed at [facility], [place] [address], [place], [place] [address]    SPECREQUEST  07/12/2021 [phone]    BOTTLES DRAWN AEROBIC AND ANAEROBIC Blood Culture adequate volume Performed at [facility] [address], [place], [place] [address]    CULT  07/12/2021 [phone]    NO GROWTH < 24 HOURS Performed at [facility] [address], [place], [place] [address]    REPTSTATUS PENDING 07/12/2021 0932    Other culture-see note  Objective: Vitals: Today's Vitals   07/13/21 0427 07/13/21 0730 07/13/21 0900 07/13/21 0930  BP: (!) 142/66 (!) 147/70    Pulse: (!) 108 100    Resp: 20     Temp: 98.5 F (36.9 C) 98.8 F (37.1 C)    TempSrc: Oral Oral    SpO2: 92% 96%    Weight:      Height:      PainSc:   6  2     Intake/Output Summary (Last 24 hours) at 07/13/2021 1058 Last data filed at 07/13/2021 0400 Gross per 24 hour  Intake 765.44 ml  Output --  Net 765.44 ml   Filed Weights   07/12/21 0651  Weight: 71.2  kg   Weight change:   Intake/Output from previous day: 09/02 0701 - 09/03 0700 In: 765.4 [I.V.:665.4; IV Piggyback:100] Out: -  Intake/Output this shift: No intake/output data recorded. Filed Weights   07/12/21 0651  Weight: 71.2 kg   Examination: General exam: AAOx3, older than his  stated age,no hair, coughing. HEENT:Oral mucosa moist, Ear/Nose WNL grossly,dentition normal. Respiratory system: bilaterally diffuse wheezing, no use of accessory muscle, non tender. Cardiovascular system: S1 & S2 +,No JVD. Gastrointestinal system: Abdomen soft, NT,ND, BS+. Nervous System:Alert, awake, moving extremities Extremities: Mild trace lower leg edema on the left, distal peripheral pulses palpable.  Skin: No rashes,no icterus. MSK: Normal muscle bulk,tone, power.  Data Reviewed: I have personally reviewed following labs and imaging studies CBC: Recent Labs  Lab 07/12/21 0628 07/13/21 0418  WBC 18.5* 16.4*  NEUTROABS 16.5*  --   HGB 9.8* 8.6*  HCT 31.2* 27.9*  MCV 97.8 99.3  PLT 173 062   Basic Metabolic Panel: Recent Labs  Lab 07/12/21 0628 07/13/21 0418  NA 137 140  K 3.6 4.1  CL 101 107  CO2 28 29  GLUCOSE 168* 116*  BUN 15 18  CREATININE 1.00 1.18*  CALCIUM 9.6 8.9   GFR: Estimated Creatinine Clearance: 47.7 mL/min (A) (by C-G formula based on SCr of 1.18 mg/dL (H)). Liver Function Tests: Recent Labs  Lab 07/12/21 0628 07/13/21 0418  AST 15 11*  ALT 10 9  ALKPHOS 67 68  BILITOT 0.3 0.7  PROT 5.4* 5.2*  ALBUMIN 2.6* 2.4*   No results for input(s): LIPASE, AMYLASE in the last 168 hours. No results for input(s): AMMONIA in the last 168 hours. Coagulation Profile: Recent Labs  Lab 07/12/21 0628 07/13/21 0418  INR 1.1 1.2   Cardiac Enzymes: No results for input(s): CKTOTAL, CKMB, CKMBINDEX, TROPONINI in the last 168 hours. BNP (last 3 results) No results for input(s): PROBNP in the last 8760 hours. HbA1C: No results for input(s): HGBA1C in the last 72 hours. CBG: Recent Labs  Lab 07/12/21 1116 07/12/21 1811 07/12/21 2144 07/13/21 0748  GLUCAP 133* 85 106* 109*   Lipid Profile: No results for input(s): CHOL, HDL, LDLCALC, TRIG, CHOLHDL, LDLDIRECT in the last 72 hours. Thyroid Function Tests: No results for input(s): TSH, T4TOTAL,  FREET4, T3FREE, THYROIDAB in the last 72 hours. Anemia Panel: No results for input(s): VITAMINB12, FOLATE, FERRITIN, TIBC, IRON, RETICCTPCT in the last 72 hours. Sepsis Labs: Recent Labs  Lab 07/12/21 0628 07/12/21 0828  LATICACIDVEN 2.8* 2.6*    Recent Results (from the past 240 hour(s))  Blood culture (routine x 2)     Status: None (Preliminary result)   Collection Time: 07/12/21  6:28 AM   Specimen: BLOOD LEFT HAND  Result Value Ref Range Status   Specimen Description   Final    BLOOD LEFT HAND Performed at [facility] [address] [place], [place] [address]    Special Requests   Final    BOTTLES DRAWN AEROBIC AND ANAEROBIC Blood Culture results may not be optimal due to an inadequate volume of blood received in culture bottles Performed at [facility] [address], [place], [place] [address]    Culture   Final    NO GROWTH < 24 HOURS Performed at [facility] [address] [place], [place] [address]    Report Status PENDING  Incomplete  Blood culture (routine x 2)     Status: None (Preliminary result)   Collection Time: 07/12/21  6:33 AM   Specimen: BLOOD  Result Value Ref Range Status   Specimen Description   Final    BLOOD RIGHT ANTECUBITAL Performed at [facility] [address], [place], [place] [address]    Special Requests   Final    BOTTLES DRAWN AEROBIC AND ANAEROBIC Blood Culture adequate volume Performed at [facility] [address] [place], [place] [address]    Culture   Final    NO GROWTH < 24 HOURS Performed at [facility] [address], [place], [place] [address]    Report Status PENDING  Incomplete  Resp Panel by RT-PCR (Flu A&B, Covid) Nasopharyngeal Swab     Status: None   Collection Time: 07/12/21  7:10 AM   Specimen: Nasopharyngeal Swab; Nasopharyngeal(NP) swabs in vial transport medium  Result Value Ref Range Status   SARS Coronavirus 2  by RT PCR NEGATIVE NEGATIVE Final    Comment: (NOTE) SARS-CoV-2 target nucleic acids are NOT DETECTED.  The SARS-CoV-2 RNA is generally detectable in upper respiratory specimens during the acute phase of infection. The lowest concentration of SARS-CoV-2 viral copies this assay can detect is 138 copies/mL. A negative result does not preclude SARS-Cov-2 infection and should not be used as the sole basis for treatment or other patient management decisions. A negative result may occur with  improper specimen collection/handling, submission of specimen other than nasopharyngeal swab, presence of viral mutation(s) within the areas targeted by this assay, and inadequate number of viral copies(<138 copies/mL). A negative result must be combined with clinical observations, patient history, and epidemiological information. The expected result is Negative.  Fact Sheet for Patients:  EntrepreneurPulse.com.au  Fact Sheet for Healthcare Providers:  IncredibleEmployment.be  This test is no t yet approved or cleared by the [place] FDA and  has been authorized for detection and/or diagnosis of SARS-CoV-2 by FDA under an Emergency Use Authorization (EUA). This EUA will remain  in effect (meaning this test can be used) for the duration of the COVID-19 declaration under Section 564(b)(1) of the Act, 21 U.S.C.section 360bbb-3(b)(1), unless the authorization is terminated  or revoked sooner.       Influenza A by PCR NEGATIVE NEGATIVE Final   Influenza B by PCR NEGATIVE NEGATIVE Final    Comment: (NOTE) The Xpert Xpress SARS-CoV-2/FLU/RSV plus assay is intended as an aid in the diagnosis of influenza from Nasopharyngeal swab specimens and should not be used as a sole basis for treatment. Nasal washings and aspirates are unacceptable for Xpert Xpress SARS-CoV-2/FLU/RSV testing.  Fact Sheet for Patients: EntrepreneurPulse.com.au  Fact  Sheet for Healthcare Providers: IncredibleEmployment.be  This test is not yet approved or cleared by the [place] FDA and has been authorized for detection and/or diagnosis of SARS-CoV-2 by FDA under an Emergency Use Authorization (EUA). This EUA will remain in effect (meaning this test can be used) for the duration of the COVID-19 declaration under Section 564(b)(1) of the Act, 21 U.S.C. section 360bbb-3(b)(1), unless the authorization is terminated or revoked.  Performed at [facility], [place] [address] [place], [place] [address]      Radiology Studies: DG Chest 2 View  Result Date: 07/12/2021 CLINICAL DATA:  Lung cancer with treatment at outside facility. Worsening condition overnight EXAM: CHEST - 2 VIEW COMPARISON:  None. FINDINGS: Bands of airspace type opacity over the left more than right chest with dense ovoid nodule over the left upper chest. Porta catheter from left approach with tip at the  right atrium. No visible edema effusion, or pneumothorax. Artifact from EKG leads. IMPRESSION: Extensive chest opacification primarily worrisome for pneumonia, although some of this opacity may be relay the since history of lung cancer. Electronically Signed   By: Monte Fantasia M.D.   On: 07/12/2021 07:49   CT Angio Chest PE W and/or Wo Contrast  Result Date: 07/12/2021 CLINICAL DATA:  Shortness of breath, history of neoplasm in a 65 year old female. EXAM: CT ANGIOGRAPHY CHEST WITH CONTRAST TECHNIQUE: Multidetector CT imaging of the chest was performed using the standard protocol during bolus administration of intravenous contrast. Multiplanar CT image reconstructions and MIPs were obtained to evaluate the vascular anatomy. CONTRAST:  18mL OMNIPAQUE IOHEXOL 350 MG/ML SOLN COMPARISON:  Chest x-ray of the same date. FINDINGS: Cardiovascular: Heart size is normal without substantial pericardial effusion. Aortic caliber is normal with signs of aortic  atherosclerosis. LEFT-sided Port-A-Cath in situ terminating at the upper portion of the RIGHT atrium. Central pulmonary vasculature with adequate opacification. Study is however limited by respiratory motion particularly in the lung bases. No central or lobar embolus. No upper lobe embolism. No gross embolism in the lower lobe though again with limited evaluation due to marked respiratory motion. Mediastinum/Nodes: Thoracic inlet structures are normal. No axillary or mediastinal lymphadenopathy. Fullness of the bilateral hila in the setting of diffuse pneumonia. Lungs/Pleura: Multifocal airspace opacities with some evidence of subpleural sparing with the exception changes in the LEFT upper and mid lung. Airspace disease in this area contacts the pleural surface and is contiguous with an expansile bone lesion in the LEFT chest wall. Narrowing of central airways bilaterally with slit like appearance. Small bilateral pleural effusions RIGHT greater than LEFT. Upper Abdomen: Moderately large hiatal hernia. Imaged portions of liver, gallbladder, adrenal glands, pancreas, spleen and upper aspects of LEFT and RIGHT kidney are unremarkable. Musculoskeletal: Multifocal sclerotic metastatic disease all levels of the spine with involvement. Expansile sclerotic lesion involving the LEFT posterolateral fourth rib with adjacent lung disease and some stranding in the chest wall as described. This measures approximately 4.7 x 2.8 cm. Sclerosis of the sternum. Bilateral sclerosis in all visualized ribs. Findings that suggest pathologic fracture at the L1 level with changes of cement augmentation at T12. Review of the MIP images confirms the above findings. IMPRESSION: No signs of central or lobar level embolus with limited assessment of lung bases due to respiratory motion. No upper lobe emboli to the segmental level. Multifocal pneumonia in the chest perhaps superimposed on post radiation changes in the LEFT upper lobe. Correlate  with any history of prior therapy as there is a large, destructive rib lesion with more confluent airspace disease in the adjacent lung. Based on pattern of disease would correlate with any history of COVID-19 infection or any drug therapy that could predispose this patient to pneumonitis. 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 Diffuse skeletal metastases with expansile sclerotic lesion involving the LEFT posterolateral fourth rib with adjacent lung disease as described. Findings that suggest pathologic fracture at the L1 level with changes of cement augmentation at T12. Correlate with any prior imaging if available, L1 is incompletely imaged. Would also correlate with any new symptoms in this area. Moderately large hiatal hernia. Aortic Atherosclerosis (ICD10-I70.0). Electronically Signed   By: Zetta Bills M.D.   On: 07/12/2021 10:37     LOS: 1 day   Antonieta Pert, MD [facility]  07/13/2021, 10:58 AM

## 2021-07-13 NOTE — Plan of Care (Signed)
  Problem: Activity: Goal: Ability to tolerate increased activity will improve Outcome: Progressing   Problem: Clinical Measurements: Goal: Ability to maintain a body temperature in the normal range will improve Outcome: Progressing   Problem: Respiratory: Goal: Ability to maintain adequate ventilation will improve Outcome: Progressing Goal: Ability to maintain a clear airway will improve Outcome: Progressing   

## 2021-07-13 NOTE — Progress Notes (Signed)
Pharmacy Antibiotic Note  Kelli Acosta is a 65 y.o. female admitted on 07/12/2021 with pneumonia.  Pharmacy has been consulted for cefepime and vancomycin dosing.  Today, 07/13/21 WBC remains elevated SCr 1.18, up slightly. CrCl ~ 47 mL/min  Today is day #2 of IV antibiotics  Plan: Continue cefepime 2 g IV q12h Vancomycin 1500 mg LD followed by 1000 mg IV q24 for estimated AUC 483 Goal vancomycin AUC 400-550. Check levels at steady state as needed Monitor renal function, culture data.  MRSA PCR ordered  Height: 5\' 5"  (165.1 cm) Weight: 71.2 kg (157 lb) IBW/kg (Calculated) : 57  Temp (24hrs), Avg:98.8 F (37.1 C), Min:98.2 F (36.8 C), Max:99.7 F (37.6 C)  Recent Labs  Lab 07/12/21 0628 07/12/21 0828 07/13/21 0418  WBC 18.5*  --  16.4*  CREATININE 1.00  --  1.18*  LATICACIDVEN 2.8* 2.6*  --     Estimated Creatinine Clearance: 47.7 mL/min (A) (by C-G formula based on SCr of 1.18 mg/dL (H)).    Allergies  Allergen Reactions   Codeine Hives and Itching   Promethazine Other (See Comments)    jittery   Sulfa Antibiotics     Antimicrobials this admission: Cefepime 9/3 >>  vancomycin 9/3 >>   Dose adjustments this admission:  Microbiology results: 9/2 BCx: ngtd 9/3 MRSA PCR:   Thank you for allowing pharmacy to be a part of this patient's care.  Lenis Noon, PharmD 07/13/2021 10:42 AM

## 2021-07-13 NOTE — Progress Notes (Signed)
   07/13/21 0418  Respiratory  Respiratory (WDL) X  Bilateral Breath Sounds Expiratory wheezes  RT called for respiratory treatment.

## 2021-07-14 DIAGNOSIS — J189 Pneumonia, unspecified organism: Secondary | ICD-10-CM | POA: Diagnosis present

## 2021-07-14 LAB — BASIC METABOLIC PANEL
Anion gap: 5 (ref 5–15)
BUN: 18 mg/dL (ref 8–23)
CO2: 25 mmol/L (ref 22–32)
Calcium: 8.6 mg/dL — ABNORMAL LOW (ref 8.9–10.3)
Chloride: 112 mmol/L — ABNORMAL HIGH (ref 98–111)
Creatinine, Ser: 0.86 mg/dL (ref 0.44–1.00)
GFR, Estimated: >60 mL/min (ref >60)
Glucose, Bld: 172 mg/dL — ABNORMAL HIGH (ref 70–99)
Potassium: 4.4 mmol/L (ref 3.5–5.1)
Sodium: 142 mmol/L (ref 135–145)

## 2021-07-14 LAB — CBC
HCT: 25.5 % — ABNORMAL LOW (ref 36.0–46.0)
Hemoglobin: 7.9 g/dL — ABNORMAL LOW (ref 12.0–15.0)
MCH: 30.6 pg (ref 26.0–34.0)
MCHC: 31 g/dL (ref 30.0–36.0)
MCV: 98.8 fL (ref 80.0–100.0)
Platelets: 176 10*3/uL (ref 150–400)
RBC: 2.58 MIL/uL — ABNORMAL LOW (ref 3.87–5.11)
RDW: 16.5 % — ABNORMAL HIGH (ref 11.5–15.5)
WBC: 27.8 10*3/uL — ABNORMAL HIGH (ref 4.0–10.5)
nRBC: 0 % (ref 0.0–0.2)

## 2021-07-14 LAB — GLUCOSE, CAPILLARY
Glucose-Capillary: 147 mg/dL — ABNORMAL HIGH (ref 70–99)
Glucose-Capillary: 166 mg/dL — ABNORMAL HIGH (ref 70–99)
Glucose-Capillary: 112 mg/dL — ABNORMAL HIGH (ref 70–99)
Glucose-Capillary: 134 mg/dL — ABNORMAL HIGH (ref 70–99)

## 2021-07-14 LAB — LEGIONELLA PNEUMOPHILA SEROGP 1 UR AG

## 2021-07-14 MED ORDER — SENNA 8.6 MG PO TABS
1.0000 | ORAL_TABLET | Freq: Every day | ORAL | Status: DC | PRN
  Administered 2021-07-14: 8.6 mg via ORAL
  Filled 2021-07-14: qty 1

## 2021-07-14 MED ORDER — GLYCERIN (LAXATIVE) 2 G RE SUPP
1.0000 | Freq: Every day | RECTAL | Status: DC | PRN
  Administered 2021-07-14 – 2021-07-22 (×3): 1 via RECTAL
  Filled 2021-07-14 (×6): qty 1

## 2021-07-14 MED ORDER — POLYETHYLENE GLYCOL 3350 17 G PO PACK
17.0000 g | PACK | Freq: Every day | ORAL | Status: DC
  Administered 2021-07-14 – 2021-07-23 (×5): 17 g via ORAL
  Filled 2021-07-14 (×9): qty 1

## 2021-07-14 MED ORDER — SENNA 8.6 MG PO TABS
1.0000 | ORAL_TABLET | Freq: Every day | ORAL | Status: DC
  Administered 2021-07-14 – 2021-07-23 (×10): 8.6 mg via ORAL
  Filled 2021-07-14 (×10): qty 1

## 2021-07-14 MED ORDER — PSYLLIUM 95 % PO PACK
1.0000 | PACK | Freq: Two times a day (BID) | ORAL | Status: DC
  Administered 2021-07-14 – 2021-07-24 (×9): 1 via ORAL
  Filled 2021-07-14 (×22): qty 1

## 2021-07-14 MED ORDER — PREDNISONE 20 MG PO TABS
40.0000 mg | ORAL_TABLET | Freq: Every day | ORAL | Status: DC
  Administered 2021-07-15 – 2021-07-16 (×2): 40 mg via ORAL
  Filled 2021-07-14 (×2): qty 2

## 2021-07-14 MED ORDER — SODIUM CHLORIDE 0.9 % IV SOLN
2.0000 g | Freq: Three times a day (TID) | INTRAVENOUS | Status: AC
  Administered 2021-07-14 – 2021-07-18 (×14): 2 g via INTRAVENOUS
  Filled 2021-07-14 (×14): qty 2

## 2021-07-14 NOTE — Plan of Care (Signed)
  Problem: Activity: Goal: Ability to tolerate increased activity will improve Outcome: Progressing   Problem: Clinical Measurements: Goal: Ability to maintain a body temperature in the normal range will improve Outcome: Progressing   Problem: Respiratory: Goal: Ability to maintain adequate ventilation will improve Outcome: Progressing Goal: Ability to maintain a clear airway will improve Outcome: Progressing   

## 2021-07-14 NOTE — Progress Notes (Signed)
Pharmacy Antibiotic Note  Kelli Acosta is a 65 y.o. female admitted on 07/12/2021 with pneumonia.  Pharmacy has been consulted for Cefepime dosing.  Today, 07/14/21 WBC remains elevated (on steroids) SCr improved to 0.86, CrCl ~ 65 ml/min  MRSA PCR negative   Today is day #3 of IV antibiotics  Plan: Adjust Cefepime to 2g IV q8h for improved renal function Vancomycin d/c'ed per discussion with MD Monitor renal function, cultures, clinical course   Height: 5\' 5"  (165.1 cm) Weight: 71.2 kg (157 lb) IBW/kg (Calculated) : 57  Temp (24hrs), Avg:98.3 F (36.8 C), Min:97.7 F (36.5 C), Max:98.7 F (37.1 C)  Recent Labs  Lab 07/12/21 0628 07/12/21 0828 07/13/21 0418 07/13/21 1114 07/14/21 0356  WBC 18.5*  --  16.4*  --  27.8*  CREATININE 1.00  --  1.18*  --  0.86  LATICACIDVEN 2.8* 2.6*  --  1.1  --      Estimated Creatinine Clearance: 65.4 mL/min (by C-G formula based on SCr of 0.86 mg/dL).    Allergies  Allergen Reactions   Codeine Hives and Itching   Promethazine Other (See Comments)    jittery   Sulfa Antibiotics     Antimicrobials this admission: Cefepime 9/2 >>  Vancomycin 9/3 >> 9/4   Microbiology results: 9/2 BCx: ngtd 9/3 MRSA PCR: negative   Thank you for allowing pharmacy to be a part of this patient's care.   Lindell Spar, PharmD, BCPS Clinical Pharmacist  07/14/2021 10:57 AM

## 2021-07-14 NOTE — Progress Notes (Signed)
PROGRESS NOTE  Kelli Acosta    DOB: Jan 07, 1956, 65 y.o.  PPJ:[id number]  PCP: System, Provider Not In   Code Status: Full Code   DOA: 07/12/2021   LOS: 2  Brief Narrative of Current Hospitalization  Kelli Acosta is a 65 y.o. 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 They presented from home to the ED on 07/12/2021 with dyspnea x several days. In the ED, it was found that they had sepsis 2/2 possible pneumonia. They were treated with IV antibiotics.  Patient was admitted to medicine service for further workup and management of pneumonia as outlined in detail below.  07/14/21 -patient clinically improved and on home oxygen level  Assessment & Plan  Active Problems:   Sepsis ([place])  Sepsis 2/2 Pneumonia in setting of immunocompromise- remains afebrile and HR normal for >24hrs. Relying on 2L O2 [place] which is her baseline. Blood cultures negative x2 days. Resp panel neg flu/covid. WBC increased today from 16.4>27.8 likely from steroids. Clinically she is much improved.  - discontinued vancomycin today. Continue cefepime and plan to transition to PO tomorrow. Patient and daughter anxious for discharge.  DM- worsened in setting of steroids for COPD. Diet controlled - sSSI with meals  Depression- stab;e - continue home meds  HTN- well controlled. - continue home meds  COPD- stable - continue steroid (transition to oral prednisone) (9/3-9/7)  DVT prophylaxis: enoxaparin (LOVENOX) injection 40 mg Start: 07/13/21 1300 SCDs Start: 07/12/21 1032   Diet:  Diet Orders (From admission, onward)     Start     Ordered   07/12/21 1032  Diet Carb Modified Fluid consistency: Thin; Room service appropriate? Yes  Diet effective now       Question Answer Comment  Diet-HS Snack? Nothing   Calorie Level Medium 1600-2000   Fluid consistency: Thin   Room service appropriate? Yes      07/12/21 1031            Subjective 07/14/21    Pt reports feeling  much improved. Almost back to baseline. Anxious to go home.  Disposition Plan & Communication  Status is: Inpatient  Remains inpatient appropriate because:Inpatient level of care appropriate due to severity of illness  Dispo: The patient is from: Home              Anticipated d/c is to: Home              Patient currently is not medically stable to d/c.   Difficult to place patient No Family Communication: daughter at bedside   Consults, Procedures, Significant Events  Consultants:  none  Procedures/significant events:  none  Antimicrobials:  Anti-infectives (From admission, onward)    Start     Dose/Rate Route Frequency Ordered Stop   07/14/21 1400  ceFEPIme (MAXIPIME) 2 g in sodium chloride 0.9 % 100 mL IVPB        2 g 200 mL/hr over 30 Minutes Intravenous Every 8 hours 07/14/21 1056 07/19/21 0559   07/14/21 1200  vancomycin (VANCOCIN) IVPB 1000 mg/200 mL premix  Status:  Discontinued        1,000 mg 200 mL/hr over 60 Minutes Intravenous Every 24 hours 07/13/21 1037 07/14/21 1049   07/13/21 1130  vancomycin (VANCOREADY) IVPB 1500 mg/300 mL        1,500 mg 150 mL/hr over 120 Minutes Intravenous  Once 07/13/21 1036 07/13/21 1421   07/12/21 1800  ceFEPIme (MAXIPIME) 2 g in sodium chloride  0.9 % 100 mL IVPB  Status:  Discontinued        2 g 200 mL/hr over 30 Minutes Intravenous Every 12 hours 07/12/21 1049 07/14/21 1056   07/12/21 1045  ceFEPIme (MAXIPIME) 2 g in sodium chloride 0.9 % 100 mL IVPB  Status:  Discontinued        2 g 200 mL/hr over 30 Minutes Intravenous  Once 07/12/21 1031 07/12/21 1033   07/12/21 0645  ceFEPIme (MAXIPIME) 2 g in sodium chloride 0.9 % 100 mL IVPB        2 g 200 mL/hr over 30 Minutes Intravenous  Once 07/12/21 0633 07/12/21 0737        Objective   Vitals:   07/13/21 2017 07/14/21 0634 07/14/21 0903 07/14/21 1508  BP: (!) 123/59 136/66  (!) 124/57  Pulse: 96 79  87  Resp: 18 20  20   Temp: 98.7 F (37.1 C) 97.7 F (36.5 C)  98.2 F  (36.8 C)  TempSrc: Oral Oral  Oral  SpO2: 94% 95% 96% 93%  Weight:      Height:        Intake/Output Summary (Last 24 hours) at 07/14/2021 1537 Last data filed at 07/14/2021 1105 Gross per 24 hour  Intake 240 ml  Output --  Net 240 ml   Filed Weights   07/12/21 0651  Weight: 71.2 kg    Patient BMI: Body mass index is 26.13 kg/m.   Physical Exam: General: awake, alert, NAD HEENT: atraumatic, clear conjunctiva, anicteric sclera, moist mucus membranes, hearing grossly normal Respiratory: normal respiratory effort. No conversational dyspnea or accessory muscle use Cardiovascular: quick capillary refill  Nervous: A&O x4. no gross focal neurologic deficits, normal speech Extremities: moves all equally, normal tone Skin: dry, intact, normal temperature, normal color, No rashes, lesions or ulcers Psychiatry: normal mood, congruent affect, judgement and insight appear normal  Labs   I have personally reviewed following labs and imaging studies Admission on 07/12/2021  Component Date Value Ref Range Status   Lactic Acid, Venous 07/12/2021 2.8 (A) 0.5 - 1.9 mmol/L Final   Lactic Acid, Venous 07/12/2021 2.6 (A) 0.5 - 1.9 mmol/L Final   Sodium 07/12/2021 137  135 - 145 mmol/L Final   Potassium 07/12/2021 3.6  3.5 - 5.1 mmol/L Final   Chloride 07/12/2021 101  98 - 111 mmol/L Final   CO2 07/12/2021 28  22 - 32 mmol/L Final   Glucose, Bld 07/12/2021 168 (A) 70 - 99 mg/dL Final   BUN 07/12/2021 15  8 - 23 mg/dL Final   Creatinine, Ser 07/12/2021 1.00  0.44 - 1.00 mg/dL Final   Calcium 07/12/2021 9.6  8.9 - 10.3 mg/dL Final   Total Protein 07/12/2021 5.4 (A) 6.5 - 8.1 g/dL Final   Albumin 07/12/2021 2.6 (A) 3.5 - 5.0 g/dL Final   AST 07/12/2021 15  15 - 41 U/L Final   ALT 07/12/2021 10  0 - 44 U/L Final   Alkaline Phosphatase 07/12/2021 67  38 - 126 U/L Final   Total Bilirubin 07/12/2021 0.3  0.3 - 1.2 mg/dL Final   GFR, Estimated 07/12/2021 >60  >60 mL/min Final   Anion gap 07/12/2021  8  5 - 15 Final   WBC 07/12/2021 18.5 (A) 4.0 - 10.5 K/uL Final   RBC 07/12/2021 3.19 (A) 3.87 - 5.11 MIL/uL Final   Hemoglobin 07/12/2021 9.8 (A) 12.0 - 15.0 g/dL Final   HCT 07/12/2021 31.2 (A) 36.0 - 46.0 % Final   MCV 07/12/2021 97.8  80.0 - 100.0 fL Final   MCH 07/12/2021 30.7  26.0 - 34.0 pg Final   MCHC 07/12/2021 31.4  30.0 - 36.0 g/dL Final   RDW 07/12/2021 16.1 (A) 11.5 - 15.5 % Final   Platelets 07/12/2021 173  150 - 400 K/uL Final   nRBC 07/12/2021 0.0  0.0 - 0.2 % Final   Neutrophils Relative % 07/12/2021 90  % Final   Neutro Abs 07/12/2021 16.5 (A) 1.7 - 7.7 K/uL Final   Lymphocytes Relative 07/12/2021 2  % Final   Lymphs Abs 07/12/2021 0.4 (A) 0.7 - 4.0 K/uL Final   Monocytes Relative 07/12/2021 5  % Final   Monocytes Absolute 07/12/2021 0.9  0.1 - 1.0 K/uL Final   Eosinophils Relative 07/12/2021 0  % Final   Eosinophils Absolute 07/12/2021 0.0  0.0 - 0.5 K/uL Final   Basophils Relative 07/12/2021 0  % Final   Basophils Absolute 07/12/2021 0.1  0.0 - 0.1 K/uL Final   Immature Granulocytes 07/12/2021 3  % Final   Abs Immature Granulocytes 07/12/2021 0.55 (A) 0.00 - 0.07 K/uL Final   Polychromasia 07/12/2021 PRESENT   Final   Giant PLTs 07/12/2021 PRESENT   Final   Prothrombin Time 07/12/2021 13.9  11.4 - 15.2 seconds Final   INR 07/12/2021 1.1  0.8 - 1.2 Final   aPTT 07/12/2021 98 (A) 24 - 36 seconds Final   Color, Urine 07/12/2021 YELLOW (A) YELLOW Final   APPearance 07/12/2021 CLEAR  CLEAR Final   Specific Gravity, Urine 07/12/2021 <1.005 (A) 1.005 - 1.030 Final   pH 07/12/2021 6.0  5.0 - 8.0 Final   Glucose, UA 07/12/2021 NEGATIVE  NEGATIVE mg/dL Final   Hgb urine dipstick 07/12/2021 NEGATIVE  NEGATIVE Final   Bilirubin Urine 07/12/2021 NEGATIVE  NEGATIVE Final   Ketones, ur 07/12/2021 NEGATIVE  NEGATIVE mg/dL Final   Protein, ur 07/12/2021 NEGATIVE  NEGATIVE mg/dL Final   Nitrite 07/12/2021 NEGATIVE  NEGATIVE Final   Leukocytes,Ua 07/12/2021 NEGATIVE  NEGATIVE  Final   RBC / HPF 07/12/2021 0-5  0 - 5 RBC/hpf Final   WBC, UA 07/12/2021 0-5  0 - 5 WBC/hpf Final   Bacteria, UA 07/12/2021 RARE (A) NONE SEEN Final   Squamous Epithelial / LPF 07/12/2021 0-5  0 - 5 Final   Specimen Description 07/12/2021    Final                   Value:BLOOD LEFT HAND Performed at [facility], [place] [address], [place], [place] [address]    Special Requests 07/12/2021    Final                   Value:BOTTLES DRAWN AEROBIC AND ANAEROBIC Blood Culture results may not be optimal due to an inadequate volume of blood received in culture bottles Performed at [facility], [place] [address], [place], [place] [address]    Culture 07/12/2021    Final                   Value:NO GROWTH 2 DAYS Performed at [facility], [place] [address] [place], [place] [address]    Report Status 07/12/2021 PENDING   Incomplete   Specimen Description 07/12/2021    Final                   Value:BLOOD RIGHT ANTECUBITAL Performed at [facility], [place] [address] [place], [place] [address]    Special Requests  07/12/2021    Final                   Value:BOTTLES DRAWN AEROBIC AND ANAEROBIC Blood Culture adequate volume Performed at [facility] [address] [place], [place] [address]    Culture 07/12/2021    Final                   Value:NO GROWTH 2 DAYS Performed at [facility], [place] [address], [place], [place] [address]    Report Status 07/12/2021 PENDING   Incomplete   Troponin I (High Sensitivity) 07/12/2021 7  <18 ng/L Final   SARS Coronavirus 2 by RT PCR 07/12/2021 NEGATIVE  NEGATIVE Final   Influenza A by PCR 07/12/2021 NEGATIVE  NEGATIVE Final   Influenza B by PCR 07/12/2021 NEGATIVE  NEGATIVE Final   Troponin I (High Sensitivity) 07/12/2021 8  <18 ng/L Final   HIV Screen 4th Generation wRfx 07/13/2021 Non Reactive  Non Reactive Final   Hgb A1c MFr Bld 07/13/2021 5.2  4.8 - 5.6 % Final   Mean  Plasma Glucose 07/13/2021 102.54  mg/dL Final   Glucose-Capillary 07/12/2021 133 (A) 70 - 99 mg/dL Final   Prothrombin Time 07/13/2021 15.5 (A) 11.4 - 15.2 seconds Final   INR 07/13/2021 1.2  0.8 - 1.2 Final   Cortisol - AM 07/13/2021 17.1  6.7 - 22.6 ug/dL Final   Sodium 07/13/2021 140  135 - 145 mmol/L Final   Potassium 07/13/2021 4.1  3.5 - 5.1 mmol/L Final   Chloride 07/13/2021 107  98 - 111 mmol/L Final   CO2 07/13/2021 29  22 - 32 mmol/L Final   Glucose, Bld 07/13/2021 116 (A) 70 - 99 mg/dL Final   BUN 07/13/2021 18  8 - 23 mg/dL Final   Creatinine, Ser 07/13/2021 1.18 (A) 0.44 - 1.00 mg/dL Final   Calcium 07/13/2021 8.9  8.9 - 10.3 mg/dL Final   Total Protein 07/13/2021 5.2 (A) 6.5 - 8.1 g/dL Final   Albumin 07/13/2021 2.4 (A) 3.5 - 5.0 g/dL Final   AST 07/13/2021 11 (A) 15 - 41 U/L Final   ALT 07/13/2021 9  0 - 44 U/L Final   Alkaline Phosphatase 07/13/2021 68  38 - 126 U/L Final   Total Bilirubin 07/13/2021 0.7  0.3 - 1.2 mg/dL Final   GFR, Estimated 07/13/2021 52 (A) >60 mL/min Final   Anion gap 07/13/2021 4 (A) 5 - 15 Final   WBC 07/13/2021 16.4 (A) 4.0 - 10.5 K/uL Final   RBC 07/13/2021 2.81 (A) 3.87 - 5.11 MIL/uL Final   Hemoglobin 07/13/2021 8.6 (A) 12.0 - 15.0 g/dL Final   HCT 07/13/2021 27.9 (A) 36.0 - 46.0 % Final   MCV 07/13/2021 99.3  80.0 - 100.0 fL Final   MCH 07/13/2021 30.6  26.0 - 34.0 pg Final   MCHC 07/13/2021 30.8  30.0 - 36.0 g/dL Final   RDW 07/13/2021 16.5 (A) 11.5 - 15.5 % Final   Platelets 07/13/2021 162  150 - 400 K/uL Final   nRBC 07/13/2021 0.0  0.0 - 0.2 % Final   Glucose-Capillary 07/12/2021 85  70 - 99 mg/dL Final   Glucose-Capillary 07/12/2021 106 (A) 70 - 99 mg/dL Final   Glucose-Capillary 07/13/2021 109 (A) 70 - 99 mg/dL Final   Lactic Acid, Venous 07/13/2021 1.1  0.5 - 1.9 mmol/L Final   Strep Pneumo Urinary Antigen 07/13/2021 NEGATIVE  NEGATIVE Final   MRSA by PCR Next Gen 07/13/2021 NOT DETECTED  NOT DETECTED Final   Glucose-Capillary  07/13/2021 143 (A) 70 - 99 mg/dL Final   Glucose-Capillary 07/13/2021 143 (A) 70 - 99 mg/dL Final   Sodium 07/14/2021 142  135 - 145 mmol/L Final   Potassium 07/14/2021 4.4  3.5 - 5.1 mmol/L Final   Chloride 07/14/2021 112 (A) 98 - 111 mmol/L Final   CO2 07/14/2021 25  22 - 32 mmol/L Final   Glucose, Bld 07/14/2021 172 (A) 70 - 99 mg/dL Final   BUN 07/14/2021 18  8 - 23 mg/dL Final   Creatinine, Ser 07/14/2021 0.86  0.44 - 1.00 mg/dL Final   Calcium 07/14/2021 8.6 (A) 8.9 - 10.3 mg/dL Final   GFR, Estimated 07/14/2021 >60  >60 mL/min Final   Anion gap 07/14/2021 5  5 - 15 Final   WBC 07/14/2021 27.8 (A) 4.0 - 10.5 K/uL Final   RBC 07/14/2021 2.58 (A) 3.87 - 5.11 MIL/uL Final   Hemoglobin 07/14/2021 7.9 (A) 12.0 - 15.0 g/dL Final   HCT 07/14/2021 25.5 (A) 36.0 - 46.0 % Final   MCV 07/14/2021 98.8  80.0 - 100.0 fL Final   MCH 07/14/2021 30.6  26.0 - 34.0 pg Final   MCHC 07/14/2021 31.0  30.0 - 36.0 g/dL Final   RDW 07/14/2021 16.5 (A) 11.5 - 15.5 % Final   Platelets 07/14/2021 176  150 - 400 K/uL Final   nRBC 07/14/2021 0.0  0.0 - 0.2 % Final   Glucose-Capillary 07/13/2021 257 (A) 70 - 99 mg/dL Final   Glucose-Capillary 07/14/2021 147 (A) 70 - 99 mg/dL Final   Glucose-Capillary 07/14/2021 166 (A) 70 - 99 mg/dL Final     Recent Results (from the past 240 hour(s))  Blood culture (routine x 2)     Status: None (Preliminary result)   Collection Time: 07/12/21  6:28 AM   Specimen: BLOOD LEFT HAND  Result Value Ref Range Status   Specimen Description   Final    BLOOD LEFT HAND Performed at [facility], [place] [address] [place], [place] [address]    Special Requests   Final    BOTTLES DRAWN AEROBIC AND ANAEROBIC Blood Culture results may not be optimal due to an inadequate volume of blood received in culture bottles Performed at [place] [address] [place], [place] [address]    Culture   Final    NO GROWTH 2 DAYS Performed at [place] [place] [address], [place], [place] [address]    Report Status PENDING  Incomplete  Blood culture (routine x 2)     Status: None (Preliminary result)   Collection Time: 07/12/21  6:33 AM   Specimen: BLOOD  Result Value Ref Range Status   Specimen Description   Final    BLOOD RIGHT ANTECUBITAL Performed at [place] [address] [place], [place] [address]    Special Requests   Final    BOTTLES DRAWN AEROBIC AND ANAEROBIC Blood Culture adequate volume Performed at [facility] [address], [place], [place] [address]    Culture   Final    NO GROWTH 2 DAYS Performed at [facility] [address], [place],  [address]    Report Status PENDING  Incomplete  Resp Panel by RT-PCR (Flu A&B, Covid) Nasopharyngeal Swab     Status: None   Collection Time: 07/12/21  7:10 AM   Specimen: Nasopharyngeal Swab; Nasopharyngeal(NP) swabs in vial transport medium  Result Value Ref Range  Status   SARS Coronavirus 2 by RT PCR NEGATIVE NEGATIVE Final    Comment: (NOTE) SARS-CoV-2 target nucleic acids are NOT DETECTED.  The SARS-CoV-2 RNA is generally detectable in upper respiratory specimens during the acute phase of infection. The lowest concentration of SARS-CoV-2 viral copies this assay can detect is 138 copies/mL. A negative result does not preclude SARS-Cov-2 infection and should not be used as the sole basis for treatment or other patient management decisions. A negative result may occur with  improper specimen collection/handling, submission of specimen other than nasopharyngeal swab, presence of viral mutation(s) within the areas targeted by this assay, and inadequate number of viral copies(<138 copies/mL). A negative result must be combined with clinical observations, patient history, and epidemiological information. The expected result is Negative.  Fact Sheet for Patients:   EntrepreneurPulse.com.au  Fact Sheet for Healthcare Providers:  IncredibleEmployment.be  This test is no t yet approved or cleared by the [place] FDA and  has been authorized for detection and/or diagnosis of SARS-CoV-2 by FDA under an Emergency Use Authorization (EUA). This EUA will remain  in effect (meaning this test can be used) for the duration of the COVID-19 declaration under Section 564(b)(1) of the Act, 21 U.S.C.section 360bbb-3(b)(1), unless the authorization is terminated  or revoked sooner.       Influenza A by PCR NEGATIVE NEGATIVE Final   Influenza B by PCR NEGATIVE NEGATIVE Final    Comment: (NOTE) The Xpert Xpress SARS-CoV-2/FLU/RSV plus assay is intended as an aid in the diagnosis of influenza from Nasopharyngeal swab specimens and should not be used as a sole basis for treatment. Nasal washings and aspirates are unacceptable for Xpert Xpress SARS-CoV-2/FLU/RSV testing.  Fact Sheet for Patients: EntrepreneurPulse.com.au  Fact Sheet for Healthcare Providers: IncredibleEmployment.be  This test is not yet approved or cleared by the [place] FDA and has been authorized for detection and/or diagnosis of SARS-CoV-2 by FDA under an Emergency Use Authorization (EUA). This EUA will remain in effect (meaning this test can be used) for the duration of the COVID-19 declaration under Section 564(b)(1) of the Act, 21 U.S.C. section 360bbb-3(b)(1), unless the authorization is terminated or revoked.  Performed at [facility], [place] [address] [place], [place] [address]   MRSA Next Gen by PCR, Nasal     Status: None   Collection Time: 07/13/21 10:22 AM   Specimen: Nasal Mucosa; Nasal Swab  Result Value Ref Range Status   MRSA by PCR Next Gen NOT DETECTED NOT DETECTED Final    Comment: (NOTE) The GeneXpert MRSA Assay (FDA approved for NASAL specimens only), is one  component of a comprehensive MRSA colonization surveillance program. It is not intended to diagnose MRSA infection nor to guide or monitor treatment for MRSA infections. Test performance is not FDA approved in patients less than 13 years old. Performed at [facility], [place] [address] [place], [place] [address]      Imaging Studies  No results found. Medications   Scheduled Meds:  Budeson-Glycopyrrol-Formoterol  2 puff Inhalation BID   calcium carbonate  1 tablet Oral TID WC   Chlorhexidine Gluconate Cloth  6 each Topical Daily   cholecalciferol  1,000 Units Oral Daily   dexmethylphenidate  5 mg Oral BID   enoxaparin (LOVENOX) injection  40 mg Subcutaneous Q24H   feeding supplement (GLUCERNA SHAKE)  237 mL Oral TID BM   folic acid  1 mg Oral BID   guaiFENesin  600 mg Oral BID  insulin aspart  0-6 Units Subcutaneous TID WC   multivitamin with minerals  1 tablet Oral Daily   pantoprazole  40 mg Oral Daily   polyethylene glycol  17 g Oral Daily   [START ON 07/15/2021] predniSONE  40 mg Oral Q breakfast   psyllium  1 packet Oral BID   senna  1 tablet Oral Daily   sertraline  100 mg Oral Daily   vitamin E  400 Units Oral Daily   Continuous Infusions:  ceFEPime (MAXIPIME) IV 2 g (07/14/21 1313)     LOS: 2 days   Time spent: >45min   Jenina Moening L Render Marley, DO [facility] 07/14/2021, 3:37 PM   To contact the [facility] Attending or Consulting provider for this patient: Check the care team in [facility] for a) attending/consulting [place] provider listed and b) the [facility] team listed Log into [email or web address] and use [place] universal password to access. If you do not have the password, please contact the hospital operator. Locate the [facility] provider you are looking for under [facility] and page to a number that you can be directly reached. If you still have difficulty reaching the provider, please page the [facility] (Director on Call) for the Hospitalists listed on amion  for assistance.

## 2021-07-15 LAB — CBC
HCT: 28 % — ABNORMAL LOW (ref 36.0–46.0)
Hemoglobin: 8.4 g/dL — ABNORMAL LOW (ref 12.0–15.0)
MCH: 30.1 pg (ref 26.0–34.0)
MCHC: 30 g/dL (ref 30.0–36.0)
MCV: 100.4 fL — ABNORMAL HIGH (ref 80.0–100.0)
Platelets: 242 10*3/uL (ref 150–400)
RBC: 2.79 MIL/uL — ABNORMAL LOW (ref 3.87–5.11)
RDW: 17.1 % — ABNORMAL HIGH (ref 11.5–15.5)
WBC: 34.4 10*3/uL — ABNORMAL HIGH (ref 4.0–10.5)
nRBC: 0.1 % (ref 0.0–0.2)

## 2021-07-15 LAB — GLUCOSE, CAPILLARY
Glucose-Capillary: 95 mg/dL (ref 70–99)
Glucose-Capillary: 106 mg/dL — ABNORMAL HIGH (ref 70–99)
Glucose-Capillary: 131 mg/dL — ABNORMAL HIGH (ref 70–99)
Glucose-Capillary: 121 mg/dL — ABNORMAL HIGH (ref 70–99)

## 2021-07-15 MED ORDER — LIP MEDEX EX OINT
TOPICAL_OINTMENT | CUTANEOUS | Status: DC | PRN
  Filled 2021-07-15: qty 7

## 2021-07-15 MED ORDER — IPRATROPIUM BROMIDE 0.06 % NA SOLN
2.0000 | Freq: Two times a day (BID) | NASAL | Status: AC
  Administered 2021-07-15 (×2): 2 via NASAL
  Filled 2021-07-15: qty 15

## 2021-07-15 MED ORDER — SORBITOL 70 % SOLN
960.0000 mL | TOPICAL_OIL | Freq: Once | ORAL | Status: AC
  Administered 2021-07-15: 960 mL via RECTAL
  Filled 2021-07-15: qty 473

## 2021-07-15 MED ORDER — IPRATROPIUM-ALBUTEROL 0.5-2.5 (3) MG/3ML IN SOLN
3.0000 mL | RESPIRATORY_TRACT | Status: DC
  Administered 2021-07-15 – 2021-07-16 (×5): 3 mL via RESPIRATORY_TRACT
  Filled 2021-07-15 (×6): qty 3

## 2021-07-15 MED ORDER — IPRATROPIUM-ALBUTEROL 0.5-2.5 (3) MG/3ML IN SOLN: 3.0000 mL | RESPIRATORY_TRACT | Status: DC

## 2021-07-15 NOTE — Progress Notes (Signed)
Rechecked patient status Tolerated SMOG enema well. Was currently sitting on commode and having a possible BM while talking. Her respiratory status was much improved. No accessory muscle use or conversational dyspnea. She looked more comfortable overall. She did not cough during our encounter. - continue duonebs q4 while awake.  - atrovent nasal spray for nasal drainage - incentive spirometer q1h while awake - continue bowel regimen

## 2021-07-15 NOTE — Progress Notes (Signed)
RT called for prn breathing tx. Pt currently on 4L O2 via [place], coughing repeatedly with breakfast tray over her bed. Pt states she was drinking fluids but hasn't been able to eat anything else. Coarse crackles noted only in the RUL, RRT's assessment leads towards possible aspiration when drinking fluids and recommends a speech eval. Will notify MD.

## 2021-07-15 NOTE — Progress Notes (Signed)
PROGRESS NOTE  Kelli Acosta    DOB: 1956/09/17, 65 y.o.  OJJ:[id number]  PCP: System, Provider Not In   Code Status: Full Code   DOA: 07/12/2021   LOS: 3  Brief Narrative of Current Hospitalization  Kelli Acosta is a 65 y.o. 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 They presented from home to the ED on 07/12/2021 with dyspnea x several days. In the ED, it was found that they had sepsis 2/2 possible pneumonia. They were treated with IV antibiotics.  Patient was admitted to medicine service for further workup and management of pneumonia as outlined in detail below.  07/15/21 -respiratory status worsened since yesterday as well as constipation  Assessment & Plan  Active Problems:   Sepsis ([place])   Community acquired pneumonia  Sepsis 2/2 Pneumonia in setting of immunocompromise- remains afebrile and HR normal. Overnight, patient had coughing spell and worsening DOE. Required increase of O2 to 4L [place]. Status improved greatly after a duoneb but continues to have worsened cough and respiratory status today. Blood cultures negative x3 days. WBC continues to increase today from 16.4>27.8>34.4 likely from steroids. On exam- diffuse wheezing and accessory muscle use. Able to speak in full sentences. - Continue cefepime - resp therapy consult to deliver breathing scheduled duonebs - incentive spirometer - trial of atrovent nasal spray to help reduce post-nasal drip which could be contributing to dry cough  Chronic constipation- last BM about 3 days ago and feeling uncomfortable. Received suppository last night. - continue oral miralax and fiber supplements. - SMOG enema today  DM- worsened in setting of steroids for COPD. Diet controlled - sSSI with meals  Depression- stable - continue home meds  HTN- well controlled. - continue home meds  COPD- stable - continue steroid (transition to oral prednisone) (9/3-9/7) - continue home controller  inhaler  DVT prophylaxis: enoxaparin (LOVENOX) injection 40 mg Start: 07/13/21 1300 SCDs Start: 07/12/21 1032   Diet:  Diet Orders (From admission, onward)     Start     Ordered   07/12/21 1032  Diet Carb Modified Fluid consistency: Thin; Room service appropriate? Yes  Diet effective now       Question Answer Comment  Diet-HS Snack? Nothing   Calorie Level Medium 1600-2000   Fluid consistency: Thin   Room service appropriate? Yes      07/12/21 1031            Subjective 07/15/21    Pt reports increased discomfort from constipation. Overnight, had increased coughing and DOE with need for increased oxygen use to 4L. Continues to feel short of breath at rest but O2 sats stayed normal throughout and status was greatly improved with breathing treatment.   Disposition Plan & Communication  Status is: Inpatient  Remains inpatient appropriate because:Inpatient level of care appropriate due to severity of illness  Dispo: The patient is from: Home              Anticipated d/c is to: Home              Patient currently is not medically stable to d/c.   Difficult to place patient No Family Communication: daughter at bedside   Consults, Procedures, Significant Events  Consultants:  none  Procedures/significant events:  none  Antimicrobials:  Anti-infectives (From admission, onward)    Start     Dose/Rate Route Frequency Ordered Stop   07/14/21 1400  ceFEPIme (MAXIPIME) 2 g in sodium chloride 0.9 %  100 mL IVPB        2 g 200 mL/hr over 30 Minutes Intravenous Every 8 hours 07/14/21 1056 07/19/21 0559   07/14/21 1200  vancomycin (VANCOCIN) IVPB 1000 mg/200 mL premix  Status:  Discontinued        1,000 mg 200 mL/hr over 60 Minutes Intravenous Every 24 hours 07/13/21 1037 07/14/21 1049   07/13/21 1130  vancomycin (VANCOREADY) IVPB 1500 mg/300 mL        1,500 mg 150 mL/hr over 120 Minutes Intravenous  Once 07/13/21 1036 07/13/21 1421   07/12/21 1800  ceFEPIme (MAXIPIME) 2 g in  sodium chloride 0.9 % 100 mL IVPB  Status:  Discontinued        2 g 200 mL/hr over 30 Minutes Intravenous Every 12 hours 07/12/21 1049 07/14/21 1056   07/12/21 1045  ceFEPIme (MAXIPIME) 2 g in sodium chloride 0.9 % 100 mL IVPB  Status:  Discontinued        2 g 200 mL/hr over 30 Minutes Intravenous  Once 07/12/21 1031 07/12/21 1033   07/12/21 0645  ceFEPIme (MAXIPIME) 2 g in sodium chloride 0.9 % 100 mL IVPB        2 g 200 mL/hr over 30 Minutes Intravenous  Once 07/12/21 0633 07/12/21 0737        Objective   Vitals:   07/14/21 1508 07/14/21 2026 07/15/21 0427 07/15/21 0914  BP: (!) 124/57 (!) 141/61 (!) 126/56   Pulse: 87 91 89   Resp: 20 16 (!) 21   Temp: 98.2 F (36.8 C) 98.7 F (37.1 C) 97.9 F (36.6 C)   TempSrc: Oral Oral Oral   SpO2: 93% 93% 91% 92%  Weight:      Height:        Intake/Output Summary (Last 24 hours) at 07/15/2021 1238 Last data filed at 07/15/2021 0354 Gross per 24 hour  Intake 300 ml  Output --  Net 300 ml   Filed Weights   07/12/21 0651  Weight: 71.2 kg    Patient BMI: Body mass index is 26.13 kg/m.   Physical Exam: General: awake, alert, NAD HEENT: atraumatic, clear conjunctiva, anicteric sclera, moist mucus membranes, hearing grossly normal Respiratory: increased respiratory effort with accessory muscle use. Speaking in full sentences with some conversational dyspnea. Diffuse wheezing and dry cough on exam.  Cardiovascular: quick capillary refill  Nervous: A&O x4. no gross focal neurologic deficits, normal speech Extremities: moves all equally, normal tone Skin: dry, intact, normal temperature, normal color, No rashes, lesions or ulcers Psychiatry: normal mood, congruent affect, judgement and insight appear normal  Labs   I have personally reviewed following labs and imaging studies Admission on 07/12/2021  Component Date Value Ref Range Status   Lactic Acid, Venous 07/12/2021 2.8 (A) 0.5 - 1.9 mmol/L Final   Lactic Acid, Venous  07/12/2021 2.6 (A) 0.5 - 1.9 mmol/L Final   Sodium 07/12/2021 137  135 - 145 mmol/L Final   Potassium 07/12/2021 3.6  3.5 - 5.1 mmol/L Final   Chloride 07/12/2021 101  98 - 111 mmol/L Final   CO2 07/12/2021 28  22 - 32 mmol/L Final   Glucose, Bld 07/12/2021 168 (A) 70 - 99 mg/dL Final   BUN 07/12/2021 15  8 - 23 mg/dL Final   Creatinine, Ser 07/12/2021 1.00  0.44 - 1.00 mg/dL Final   Calcium 07/12/2021 9.6  8.9 - 10.3 mg/dL Final   Total Protein 07/12/2021 5.4 (A) 6.5 - 8.1 g/dL Final   Albumin 07/12/2021 2.6 (  A) 3.5 - 5.0 g/dL Final   AST 07/12/2021 15  15 - 41 U/L Final   ALT 07/12/2021 10  0 - 44 U/L Final   Alkaline Phosphatase 07/12/2021 67  38 - 126 U/L Final   Total Bilirubin 07/12/2021 0.3  0.3 - 1.2 mg/dL Final   GFR, Estimated 07/12/2021 >60  >60 mL/min Final   Anion gap 07/12/2021 8  5 - 15 Final   WBC 07/12/2021 18.5 (A) 4.0 - 10.5 K/uL Final   RBC 07/12/2021 3.19 (A) 3.87 - 5.11 MIL/uL Final   Hemoglobin 07/12/2021 9.8 (A) 12.0 - 15.0 g/dL Final   HCT 07/12/2021 31.2 (A) 36.0 - 46.0 % Final   MCV 07/12/2021 97.8  80.0 - 100.0 fL Final   MCH 07/12/2021 30.7  26.0 - 34.0 pg Final   MCHC 07/12/2021 31.4  30.0 - 36.0 g/dL Final   RDW 07/12/2021 16.1 (A) 11.5 - 15.5 % Final   Platelets 07/12/2021 173  150 - 400 K/uL Final   nRBC 07/12/2021 0.0  0.0 - 0.2 % Final   Neutrophils Relative % 07/12/2021 90  % Final   Neutro Abs 07/12/2021 16.5 (A) 1.7 - 7.7 K/uL Final   Lymphocytes Relative 07/12/2021 2  % Final   Lymphs Abs 07/12/2021 0.4 (A) 0.7 - 4.0 K/uL Final   Monocytes Relative 07/12/2021 5  % Final   Monocytes Absolute 07/12/2021 0.9  0.1 - 1.0 K/uL Final   Eosinophils Relative 07/12/2021 0  % Final   Eosinophils Absolute 07/12/2021 0.0  0.0 - 0.5 K/uL Final   Basophils Relative 07/12/2021 0  % Final   Basophils Absolute 07/12/2021 0.1  0.0 - 0.1 K/uL Final   Immature Granulocytes 07/12/2021 3  % Final   Abs Immature Granulocytes 07/12/2021 0.55 (A) 0.00 - 0.07 K/uL  Final   Polychromasia 07/12/2021 PRESENT   Final   Giant PLTs 07/12/2021 PRESENT   Final   Prothrombin Time 07/12/2021 13.9  11.4 - 15.2 seconds Final   INR 07/12/2021 1.1  0.8 - 1.2 Final   aPTT 07/12/2021 98 (A) 24 - 36 seconds Final   Color, Urine 07/12/2021 YELLOW (A) YELLOW Final   APPearance 07/12/2021 CLEAR  CLEAR Final   Specific Gravity, Urine 07/12/2021 <1.005 (A) 1.005 - 1.030 Final   pH 07/12/2021 6.0  5.0 - 8.0 Final   Glucose, UA 07/12/2021 NEGATIVE  NEGATIVE mg/dL Final   Hgb urine dipstick 07/12/2021 NEGATIVE  NEGATIVE Final   Bilirubin Urine 07/12/2021 NEGATIVE  NEGATIVE Final   Ketones, ur 07/12/2021 NEGATIVE  NEGATIVE mg/dL Final   Protein, ur 07/12/2021 NEGATIVE  NEGATIVE mg/dL Final   Nitrite 07/12/2021 NEGATIVE  NEGATIVE Final   Leukocytes,Ua 07/12/2021 NEGATIVE  NEGATIVE Final   RBC / HPF 07/12/2021 0-5  0 - 5 RBC/hpf Final   WBC, UA 07/12/2021 0-5  0 - 5 WBC/hpf Final   Bacteria, UA 07/12/2021 RARE (A) NONE SEEN Final   Squamous Epithelial / LPF 07/12/2021 0-5  0 - 5 Final   Specimen Description 07/12/2021    Final                   Value:BLOOD LEFT HAND Performed at [facility], [place] [address], [place], [place] [address]    Special Requests 07/12/2021    Final                   Value:BOTTLES DRAWN AEROBIC AND ANAEROBIC Blood Culture results may not be optimal due to an  inadequate volume of blood received in culture bottles Performed at [facility] [address] [place], [place] [address]    Culture 07/12/2021    Final                   Value:NO GROWTH 3 DAYS Performed at [facility] [address], [place], [place] [address]    Report Status 07/12/2021 PENDING   Incomplete   Specimen Description 07/12/2021    Final                   Value:BLOOD RIGHT ANTECUBITAL Performed at [facility], [place] [address], [place], [place] [address]    Special Requests 07/12/2021    Final                    Value:BOTTLES DRAWN AEROBIC AND ANAEROBIC Blood Culture adequate volume Performed at [facility] [address], [place], [place] [address]    Culture 07/12/2021    Final                   Value:NO GROWTH 3 DAYS Performed at [facility] [address], [place],  [address]    Report Status 07/12/2021 PENDING   Incomplete   Troponin I (High Sensitivity) 07/12/2021 7  <18 ng/L Final   SARS Coronavirus 2 by RT PCR 07/12/2021 NEGATIVE  NEGATIVE Final   Influenza A by PCR 07/12/2021 NEGATIVE  NEGATIVE Final   Influenza B by PCR 07/12/2021 NEGATIVE  NEGATIVE Final   Troponin I (High Sensitivity) 07/12/2021 8  <18 ng/L Final   HIV Screen 4th Generation wRfx 07/13/2021 Non Reactive  Non Reactive Final   Hgb A1c MFr Bld 07/13/2021 5.2  4.8 - 5.6 % Final   Mean Plasma Glucose 07/13/2021 102.54  mg/dL Final   Glucose-Capillary 07/12/2021 133 (A) 70 - 99 mg/dL Final   Prothrombin Time 07/13/2021 15.5 (A) 11.4 - 15.2 seconds Final   INR 07/13/2021 1.2  0.8 - 1.2 Final   Cortisol - AM 07/13/2021 17.1  6.7 - 22.6 ug/dL Final   Sodium 07/13/2021 140  135 - 145 mmol/L Final   Potassium 07/13/2021 4.1  3.5 - 5.1 mmol/L Final   Chloride 07/13/2021 107  98 - 111 mmol/L Final   CO2 07/13/2021 29  22 - 32 mmol/L Final   Glucose, Bld 07/13/2021 116 (A) 70 - 99 mg/dL Final   BUN 07/13/2021 18  8 - 23 mg/dL Final   Creatinine, Ser 07/13/2021 1.18 (A) 0.44 - 1.00 mg/dL Final   Calcium 07/13/2021 8.9  8.9 - 10.3 mg/dL Final   Total Protein 07/13/2021 5.2 (A) 6.5 - 8.1 g/dL Final   Albumin 07/13/2021 2.4 (A) 3.5 - 5.0 g/dL Final   AST 07/13/2021 11 (A) 15 - 41 U/L Final   ALT 07/13/2021 9  0 - 44 U/L Final   Alkaline Phosphatase 07/13/2021 68  38 - 126 U/L Final   Total Bilirubin 07/13/2021 0.7  0.3 - 1.2 mg/dL Final   GFR, Estimated 07/13/2021 52 (A) >60 mL/min Final   Anion gap 07/13/2021 4 (A) 5 - 15 Final   WBC 07/13/2021 16.4 (A) 4.0 - 10.5 K/uL Final   RBC  07/13/2021 2.81 (A) 3.87 - 5.11 MIL/uL Final   Hemoglobin 07/13/2021 8.6 (A) 12.0 - 15.0 g/dL Final   HCT 07/13/2021 27.9 (A) 36.0 - 46.0 % Final   MCV 07/13/2021 99.3  80.0 - 100.0  fL Final   MCH 07/13/2021 30.6  26.0 - 34.0 pg Final   MCHC 07/13/2021 30.8  30.0 - 36.0 g/dL Final   RDW 07/13/2021 16.5 (A) 11.5 - 15.5 % Final   Platelets 07/13/2021 162  150 - 400 K/uL Final   nRBC 07/13/2021 0.0  0.0 - 0.2 % Final   Glucose-Capillary 07/12/2021 85  70 - 99 mg/dL Final   Glucose-Capillary 07/12/2021 106 (A) 70 - 99 mg/dL Final   Glucose-Capillary 07/13/2021 109 (A) 70 - 99 mg/dL Final   Lactic Acid, Venous 07/13/2021 1.1  0.5 - 1.9 mmol/L Final   Strep Pneumo Urinary Antigen 07/13/2021 NEGATIVE  NEGATIVE Final   MRSA by PCR Next Gen 07/13/2021 NOT DETECTED  NOT DETECTED Final   Glucose-Capillary 07/13/2021 143 (A) 70 - 99 mg/dL Final   L. pneumophila Serogp 1 Ur Ag 07/13/2021 LA11   Final   Source of Sample 07/13/2021 URINE, RANDOM   Corrected   Glucose-Capillary 07/13/2021 143 (A) 70 - 99 mg/dL Final   Sodium 07/14/2021 142  135 - 145 mmol/L Final   Potassium 07/14/2021 4.4  3.5 - 5.1 mmol/L Final   Chloride 07/14/2021 112 (A) 98 - 111 mmol/L Final   CO2 07/14/2021 25  22 - 32 mmol/L Final   Glucose, Bld 07/14/2021 172 (A) 70 - 99 mg/dL Final   BUN 07/14/2021 18  8 - 23 mg/dL Final   Creatinine, Ser 07/14/2021 0.86  0.44 - 1.00 mg/dL Final   Calcium 07/14/2021 8.6 (A) 8.9 - 10.3 mg/dL Final   GFR, Estimated 07/14/2021 >60  >60 mL/min Final   Anion gap 07/14/2021 5  5 - 15 Final   WBC 07/14/2021 27.8 (A) 4.0 - 10.5 K/uL Final   RBC 07/14/2021 2.58 (A) 3.87 - 5.11 MIL/uL Final   Hemoglobin 07/14/2021 7.9 (A) 12.0 - 15.0 g/dL Final   HCT 07/14/2021 25.5 (A) 36.0 - 46.0 % Final   MCV 07/14/2021 98.8  80.0 - 100.0 fL Final   MCH 07/14/2021 30.6  26.0 - 34.0 pg Final   MCHC 07/14/2021 31.0  30.0 - 36.0 g/dL Final   RDW 07/14/2021 16.5 (A) 11.5 - 15.5 % Final   Platelets 07/14/2021 176   150 - 400 K/uL Final   nRBC 07/14/2021 0.0  0.0 - 0.2 % Final   Glucose-Capillary 07/13/2021 257 (A) 70 - 99 mg/dL Final   Glucose-Capillary 07/14/2021 147 (A) 70 - 99 mg/dL Final   Glucose-Capillary 07/14/2021 166 (A) 70 - 99 mg/dL Final   WBC 07/15/2021 34.4 (A) 4.0 - 10.5 K/uL Final   RBC 07/15/2021 2.79 (A) 3.87 - 5.11 MIL/uL Final   Hemoglobin 07/15/2021 8.4 (A) 12.0 - 15.0 g/dL Final   HCT 07/15/2021 28.0 (A) 36.0 - 46.0 % Final   MCV 07/15/2021 100.4 (A) 80.0 - 100.0 fL Final   MCH 07/15/2021 30.1  26.0 - 34.0 pg Final   MCHC 07/15/2021 30.0  30.0 - 36.0 g/dL Final   RDW 07/15/2021 17.1 (A) 11.5 - 15.5 % Final   Platelets 07/15/2021 242  150 - 400 K/uL Final   nRBC 07/15/2021 0.1  0.0 - 0.2 % Final   Glucose-Capillary 07/14/2021 112 (A) 70 - 99 mg/dL Final   Glucose-Capillary 07/14/2021 134 (A) 70 - 99 mg/dL Final   Glucose-Capillary 07/15/2021 95  70 - 99 mg/dL Final   Glucose-Capillary 07/15/2021 106 (A) 70 - 99 mg/dL Final     Recent Results (from the past 240 hour(s))  Blood culture (routine x 2)  Status: None (Preliminary result)   Collection Time: 07/12/21  6:28 AM   Specimen: BLOOD LEFT HAND  Result Value Ref Range Status   Specimen Description   Final    BLOOD LEFT HAND Performed at [facility] [address], [place], [place] [address]    Special Requests   Final    BOTTLES DRAWN AEROBIC AND ANAEROBIC Blood Culture results may not be optimal due to an inadequate volume of blood received in culture bottles Performed at [facility] [address] [place], [place] [address]    Culture   Final    NO GROWTH 3 DAYS Performed at [facility], [place] [address], [place], [place] [address]    Report Status PENDING  Incomplete  Blood culture (routine x 2)     Status: None (Preliminary result)   Collection Time: 07/12/21  6:33 AM   Specimen: BLOOD  Result Value Ref Range Status   Specimen Description   Final    BLOOD  RIGHT ANTECUBITAL Performed at [facility] [address] [place], [place] [address]    Special Requests   Final    BOTTLES DRAWN AEROBIC AND ANAEROBIC Blood Culture adequate volume Performed at [facility] [address], [place], [place] [address]    Culture   Final    NO GROWTH 3 DAYS Performed at [facility], [place] [address], [place], [place] [address]    Report Status PENDING  Incomplete  Resp Panel by RT-PCR (Flu A&B, Covid) Nasopharyngeal Swab     Status: None   Collection Time: 07/12/21  7:10 AM   Specimen: Nasopharyngeal Swab; Nasopharyngeal(NP) swabs in vial transport medium  Result Value Ref Range Status   SARS Coronavirus 2 by RT PCR NEGATIVE NEGATIVE Final    Comment: (NOTE) SARS-CoV-2 target nucleic acids are NOT DETECTED.  The SARS-CoV-2 RNA is generally detectable in upper respiratory specimens during the acute phase of infection. The lowest concentration of SARS-CoV-2 viral copies this assay can detect is 138 copies/mL. A negative result does not preclude SARS-Cov-2 infection and should not be used as the sole basis for treatment or other patient management decisions. A negative result may occur with  improper specimen collection/handling, submission of specimen other than nasopharyngeal swab, presence of viral mutation(s) within the areas targeted by this assay, and inadequate number of viral copies(<138 copies/mL). A negative result must be combined with clinical observations, patient history, and epidemiological information. The expected result is Negative.  Fact Sheet for Patients:  EntrepreneurPulse.com.au  Fact Sheet for Healthcare Providers:  IncredibleEmployment.be  This test is no t yet approved or cleared by the [place] FDA and  has been authorized for detection and/or diagnosis of SARS-CoV-2 by FDA under an Emergency Use Authorization (EUA). This EUA will  remain  in effect (meaning this test can be used) for the duration of the COVID-19 declaration under Section 564(b)(1) of the Act, 21 U.S.C.section 360bbb-3(b)(1), unless the authorization is terminated  or revoked sooner.       Influenza A by PCR NEGATIVE NEGATIVE Final   Influenza B by PCR NEGATIVE NEGATIVE Final    Comment: (NOTE) The Xpert Xpress SARS-CoV-2/FLU/RSV plus assay is intended as an aid in the diagnosis of influenza from Nasopharyngeal swab specimens and should not be used as a sole basis for treatment. Nasal washings and aspirates are unacceptable for Xpert Xpress SARS-CoV-2/FLU/RSV testing.  Fact Sheet for Patients: EntrepreneurPulse.com.au  Fact Sheet for Healthcare  Providers: IncredibleEmployment.be  This test is not yet approved or cleared by the [place] and has been authorized for detection and/or diagnosis of SARS-CoV-2 by FDA under an Emergency Use Authorization (EUA). This EUA will remain in effect (meaning this test can be used) for the duration of the COVID-19 declaration under Section 564(b)(1) of the Act, 21 U.S.C. section 360bbb-3(b)(1), unless the authorization is terminated or revoked.  Performed at [facility], [place] [address], [place], [place] [address]   MRSA Next Gen by PCR, Nasal     Status: None   Collection Time: 07/13/21 10:22 AM   Specimen: Nasal Mucosa; Nasal Swab  Result Value Ref Range Status   MRSA by PCR Next Gen NOT DETECTED NOT DETECTED Final    Comment: (NOTE) The GeneXpert MRSA Assay (FDA approved for NASAL specimens only), is one component of a comprehensive MRSA colonization surveillance program. It is not intended to diagnose MRSA infection nor to guide or monitor treatment for MRSA infections. Test performance is not FDA approved in patients less than 4 years old. Performed at [facility], [place] [address], [place], [place] [address]       Imaging Studies  No results found. Medications   Scheduled Meds:  Budeson-Glycopyrrol-Formoterol  2 puff Inhalation BID   calcium carbonate  1 tablet Oral TID WC   Chlorhexidine Gluconate Cloth  6 each Topical Daily   cholecalciferol  1,000 Units Oral Daily   dexmethylphenidate  5 mg Oral BID   enoxaparin (LOVENOX) injection  40 mg Subcutaneous Q24H   feeding supplement (GLUCERNA SHAKE)  237 mL Oral TID BM   folic acid  1 mg Oral BID   guaiFENesin  600 mg Oral BID   insulin aspart  0-6 Units Subcutaneous TID WC   ipratropium  2 spray Each Nare BID   ipratropium-albuterol  3 mL Nebulization Q4H while awake   multivitamin with minerals  1 tablet Oral Daily   pantoprazole  40 mg Oral Daily   polyethylene glycol  17 g Oral Daily   predniSONE  40 mg Oral Q breakfast   psyllium  1 packet Oral BID   senna  1 tablet Oral Daily   sertraline  100 mg Oral Daily   sorbitol, milk of mag, mineral oil, glycerin (SMOG) enema  960 mL Rectal Once   vitamin E  400 Units Oral Daily   Continuous Infusions:  ceFEPime (MAXIPIME) IV 2 g (07/15/21 0455)     LOS: 3 days   Time spent: >49min   Papa Piercefield L Lloyde [place], DO [facility] 07/15/2021, 12:38 PM   To contact the [facility] Attending or Consulting provider for this patient: Check the care team in [facility] for a) attending/consulting [place] provider listed and b) the [facility] team listed Log into [email or web address] and use [facility]'s universal password to access. If you do not have the password, please contact the hospital operator. Locate the [facility] provider you are looking for under [facility] and page to a number that you can be directly reached. If you still have difficulty reaching the provider, please page the [facility] (Director on Call) for the Hospitalists listed on amion for assistance.

## 2021-07-15 NOTE — Care Management Important Message (Signed)
Important Message  Patient Details IM Letter given to the Patient. Name: Kelli Acosta MRN: [id number] Date of Birth: [birth year]   Medicare Important Message Given:  Yes     Kerin Salen 07/15/2021, 1:42 PM

## 2021-07-16 ENCOUNTER — Inpatient Hospital Stay (HOSPITAL_COMMUNITY): Payer: Medicare PPO

## 2021-07-16 DIAGNOSIS — R6520 Severe sepsis without septic shock: Secondary | ICD-10-CM | POA: Diagnosis not present

## 2021-07-16 DIAGNOSIS — A419 Sepsis, unspecified organism: Secondary | ICD-10-CM | POA: Diagnosis not present

## 2021-07-16 DIAGNOSIS — J9601 Acute respiratory failure with hypoxia: Secondary | ICD-10-CM | POA: Diagnosis not present

## 2021-07-16 LAB — CBC
HCT: 24.5 % — ABNORMAL LOW (ref 36.0–46.0)
Hemoglobin: 7.6 g/dL — ABNORMAL LOW (ref 12.0–15.0)
MCH: 30.4 pg (ref 26.0–34.0)
MCHC: 31 g/dL (ref 30.0–36.0)
MCV: 98 fL (ref 80.0–100.0)
Platelets: 234 10*3/uL (ref 150–400)
RBC: 2.5 MIL/uL — ABNORMAL LOW (ref 3.87–5.11)
RDW: 16.9 % — ABNORMAL HIGH (ref 11.5–15.5)
WBC: 22 10*3/uL — ABNORMAL HIGH (ref 4.0–10.5)
nRBC: 0 % (ref 0.0–0.2)

## 2021-07-16 LAB — BASIC METABOLIC PANEL
Anion gap: 2 — ABNORMAL LOW (ref 5–15)
BUN: 22 mg/dL (ref 8–23)
CO2: 26 mmol/L (ref 22–32)
Calcium: 8.2 mg/dL — ABNORMAL LOW (ref 8.9–10.3)
Chloride: 111 mmol/L (ref 98–111)
Creatinine, Ser: 0.98 mg/dL (ref 0.44–1.00)
GFR, Estimated: >60 mL/min (ref >60)
Glucose, Bld: 109 mg/dL — ABNORMAL HIGH (ref 70–99)
Potassium: 4 mmol/L (ref 3.5–5.1)
Sodium: 139 mmol/L (ref 135–145)

## 2021-07-16 LAB — GLUCOSE, CAPILLARY
Glucose-Capillary: 102 mg/dL — ABNORMAL HIGH (ref 70–99)
Glucose-Capillary: 118 mg/dL — ABNORMAL HIGH (ref 70–99)
Glucose-Capillary: 175 mg/dL — ABNORMAL HIGH (ref 70–99)
Glucose-Capillary: 175 mg/dL — ABNORMAL HIGH (ref 70–99)

## 2021-07-16 LAB — LEGIONELLA PNEUMOPHILA SEROGP 1 UR AG: L. pneumophila Serogp 1 Ur Ag: NEGATIVE

## 2021-07-16 MED ORDER — FUROSEMIDE 10 MG/ML IJ SOLN
40.0000 mg | Freq: Once | INTRAMUSCULAR | Status: AC
  Administered 2021-07-16: 40 mg via INTRAVENOUS
  Filled 2021-07-16: qty 4

## 2021-07-16 MED ORDER — IPRATROPIUM-ALBUTEROL 0.5-2.5 (3) MG/3ML IN SOLN
3.0000 mL | Freq: Four times a day (QID) | RESPIRATORY_TRACT | Status: DC
  Administered 2021-07-17: 3 mL via RESPIRATORY_TRACT
  Filled 2021-07-16: qty 3

## 2021-07-16 MED ORDER — PREDNISONE 10 MG PO TABS
10.0000 mg | ORAL_TABLET | Freq: Every day | ORAL | Status: AC
  Administered 2021-07-17: 10 mg via ORAL
  Filled 2021-07-16: qty 1

## 2021-07-16 MED ORDER — ALBUTEROL SULFATE (2.5 MG/3ML) 0.083% IN NEBU
3.0000 mL | INHALATION_SOLUTION | RESPIRATORY_TRACT | Status: DC | PRN
  Administered 2021-07-18 – 2021-07-22 (×3): 3 mL via RESPIRATORY_TRACT
  Filled 2021-07-16 (×3): qty 3

## 2021-07-16 NOTE — Progress Notes (Signed)
PT Cancellation Note  Patient Details Name: Kelli Acosta MRN: [id number] DOB: 01/01/1956   Cancelled Treatment:    Reason Eval/Treat Not Completed: Medical issues which prohibited therapy. Will hold PT eval today after speaking with RN-reports pt has been really dyspneic today. Will likely check back on tomorrw to attempt PT eval. Thanks.    [facility]  Office: [phone] Pager: [phone]

## 2021-07-16 NOTE — Progress Notes (Addendum)
PROGRESS NOTE  Kelli Acosta  DOB: [birth year]  PCP: System, Provider Not In [id number]  DOA: 07/12/2021  LOS: 4 days  Hospital Day: 5   Chief Complaint  Patient presents with   Shortness of Breath   Fever    Brief narrative: Kelli Acosta is a 65 y.o. 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  Patient is from out of town, currently visiting her daughter and follows up with oncologist Dr. Ralene Bathe. Patient presented to the ED on 07/12/2021 with complaint of progressively worsening dyspnea for several days. CT scan obtained on admission showed multifocal pneumonia and hence was admitted to hospital service. Her respiratory status was improving however 9/4, she started to get worse and is currently on 6 L oxygen by nasal cannula.     Subjective: Patient was seen and examined this AM. Pleasant middle-aged Caucasian female.  Looks older for age.  Thin built. Daughter at bedside.  Currently complaining of back pain.  Requiring 6 L Oxymizer currently at rest.  Complains of shortness of breath on minimal exertion.  Assessment/Plan: Sepsis secondary to multifocal pneumonia in the setting of immunocompromise status -Currently on IV cefepime. -No growth in blood cultures so far. -Continue IV antibiotics for now. -Leukocytosis could be also secondary to chronic prednisone use.  Continue to monitor Recent Labs  Lab 07/12/21 0628 07/12/21 0828 07/13/21 0418 07/13/21 1114 07/14/21 0356 07/15/21 0355 07/16/21 0400  WBC 18.5*  --  16.4*  --  27.8* 34.4* 22.0*  LATICACIDVEN 2.8* 2.6*  --  1.1  --   --   --    Acute on chronic respiratory failure with hypoxia -Chronically on 2 L oxygen by nasal cannula in the setting of COPD and stage IV lung cancer -Currently requiring 6 L at rest since 9/4. -Patient states that prior to admission, she was taking Lasix 20 mg daily which is kept on hold on admission for multifocal  pneumonia and patient was given IV fluid.  I wonder if she is going through fluid overload state. -Chest x-ray obtained today showed mild improvement in the opacities but likely increased small to moderate bilateral pleural effusions. -Later this morning, I give her IV Lasix 40 mg 1 dose after which she has urinated more than 2 L urine in few hours. -Continue to monitor for respiratory status.  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 Ralene Bathe [phone]) at [place] hometown.  Last radiation was in February 2022. -Prior to admission, patient was on prednisone 10 g daily.  Currently on prednisone 40 mg daily.  COPD stable.  I would switch her back to 10 mg daily. -Continue bronchodilators.  Anemia of chronic disease -Macrocytic.  No active bleeding. Recent Labs    07/12/21 0628 07/13/21 0418 07/14/21 0356 07/15/21 0355 07/16/21 0400  HGB 9.8* 8.6* 7.9* 8.4* 7.6*  MCV 97.8 99.3 98.8 100.4* 98.0   Type 2 diabetes mellitus -A1c at 5.2 on 07/13/2021 -Currently on sliding scale insulin with Accu-Cheks. Recent Labs  Lab 07/15/21 1230 07/15/21 1624 07/15/21 2050 07/16/21 0728 07/16/21 1149  GLUCAP 106* 131* 121* 102* 118*   Chronic constipation -Continue bowel regimen   Depression -continue Zoloft, Lunesta  Mobility: PT eval ordered Code Status:   Code Status: Full Code  Nutritional status: Body mass index is 26.13 kg/m. Nutrition Problem: Inadequate oral intake Etiology: nausea, vomiting Signs/Symptoms: per patient/family report Diet:  Diet  Order             Diet Carb Modified Fluid consistency: Thin; Room service appropriate? Yes  Diet effective now                  DVT prophylaxis:  enoxaparin (LOVENOX) injection 40 mg Start: 07/13/21 1300 SCDs Start: 07/12/21 1032   Antimicrobials: IV cefepime Fluid: None Consultants: None Family Communication: Daughter Ms. Mardene Celeste at bedside  Status is: Inpatient  Remains  inpatient appropriate because: Needs inpatient monitoring, further diuresis  Dispo: The patient is from: Home              Anticipated d/c is to: Hopefully home in 1 to 2 days              Patient currently is not medically stable to d/c.   Difficult to place patient No     Infusions:   ceFEPime (MAXIPIME) IV 2 g (07/16/21 1310)    Scheduled Meds:  Budeson-Glycopyrrol-Formoterol  2 puff Inhalation BID   calcium carbonate  1 tablet Oral TID WC   Chlorhexidine Gluconate Cloth  6 each Topical Daily   cholecalciferol  1,000 Units Oral Daily   dexmethylphenidate  5 mg Oral BID   enoxaparin (LOVENOX) injection  40 mg Subcutaneous Q24H   feeding supplement (GLUCERNA SHAKE)  237 mL Oral TID BM   folic acid  1 mg Oral BID   guaiFENesin  600 mg Oral BID   insulin aspart  0-6 Units Subcutaneous TID WC   ipratropium-albuterol  3 mL Nebulization Q4H while awake   multivitamin with minerals  1 tablet Oral Daily   pantoprazole  40 mg Oral Daily   polyethylene glycol  17 g Oral Daily   [START ON 07/17/2021] predniSONE  10 mg Oral Q breakfast   psyllium  1 packet Oral BID   senna  1 tablet Oral Daily   sertraline  100 mg Oral Daily   Vitamin E  400 Units Oral Daily    Antimicrobials: Anti-infectives (From admission, onward)    Start     Dose/Rate Route Frequency Ordered Stop   07/14/21 1400  ceFEPIme (MAXIPIME) 2 g in sodium chloride 0.9 % 100 mL IVPB        2 g 200 mL/hr over 30 Minutes Intravenous Every 8 hours 07/14/21 1056 07/19/21 0559   07/14/21 1200  vancomycin (VANCOCIN) IVPB 1000 mg/200 mL premix  Status:  Discontinued        1,000 mg 200 mL/hr over 60 Minutes Intravenous Every 24 hours 07/13/21 1037 07/14/21 1049   07/13/21 1130  vancomycin (VANCOREADY) IVPB 1500 mg/300 mL        1,500 mg 150 mL/hr over 120 Minutes Intravenous  Once 07/13/21 1036 07/13/21 1421   07/12/21 1800  ceFEPIme (MAXIPIME) 2 g in sodium chloride 0.9 % 100 mL IVPB  Status:  Discontinued        2 g 200  mL/hr over 30 Minutes Intravenous Every 12 hours 07/12/21 1049 07/14/21 1056   07/12/21 1045  ceFEPIme (MAXIPIME) 2 g in sodium chloride 0.9 % 100 mL IVPB  Status:  Discontinued        2 g 200 mL/hr over 30 Minutes Intravenous  Once 07/12/21 1031 07/12/21 1033   07/12/21 0645  ceFEPIme (MAXIPIME) 2 g in sodium chloride 0.9 % 100 mL IVPB        2 g 200 mL/hr over 30 Minutes Intravenous  Once 07/12/21 0633 07/12/21 0737  PRN meds: acetaminophen **OR** acetaminophen, albuterol, ALPRAZolam, Glycerin (Adult), guaiFENesin, hydrOXYzine, ketorolac, lip balm, ondansetron **OR** ondansetron (ZOFRAN) IV, oxyCODONE, prochlorperazine, sodium chloride flush, zolpidem   Objective: Vitals:   07/16/21 1356 07/16/21 1420  BP: (!) 137/56   Pulse: (!) 103   Resp: (!) 24   Temp: 97.7 F (36.5 C)   SpO2: 91% 94%    Intake/Output Summary (Last 24 hours) at 07/16/2021 1503 Last data filed at 07/16/2021 1440 Gross per 24 hour  Intake 720 ml  Output 2200 ml  Net -1480 ml   Filed Weights   07/12/21 0651  Weight: 71.2 kg   Weight change:  Body mass index is 26.13 kg/m.   Physical Exam: General exam: Pleasant, middle-aged Caucasian female.  Looks older for her age. Skin: No rashes, lesions or ulcers. HEENT: Atraumatic, normocephalic, no obvious bleeding Lungs: Diminished air entry in both bases with bilateral rhonchi.  No wheezing CVS: Regular rate and rhythm, no murmur GI/Abd soft, nontender, nondistended, bowel sound present CNS: Alert, awake, oriented x3 Psychiatry: Depressed look Extremities: Trace bilateral pedal edema, no calf tenderness  Data Review: I have personally reviewed the laboratory data and studies available.  Recent Labs  Lab 07/12/21 0628 07/13/21 0418 07/14/21 0356 07/15/21 0355 07/16/21 0400  WBC 18.5* 16.4* 27.8* 34.4* 22.0*  NEUTROABS 16.5*  --   --   --   --   HGB 9.8* 8.6* 7.9* 8.4* 7.6*  HCT 31.2* 27.9* 25.5* 28.0* 24.5*  MCV 97.8 99.3 98.8 100.4* 98.0   PLT 173 162 176 242 234   Recent Labs  Lab 07/12/21 0628 07/13/21 0418 07/14/21 0356 07/16/21 0400  NA 137 140 142 139  K 3.6 4.1 4.4 4.0  CL 101 107 112* 111  CO2 28 29 25 26   GLUCOSE 168* 116* 172* 109*  BUN 15 18 18 22   CREATININE 1.00 1.18* 0.86 0.98  CALCIUM 9.6 8.9 8.6* 8.2*    F/u labs ordered Unresulted Labs (From admission, onward)     Start     Ordered   07/20/21 0500  Creatinine, serum  (enoxaparin (LOVENOX)    CrCl >/= 30 ml/min)  Weekly,   R     Comments: while on enoxaparin therapy   Question:  Specimen collection method  Answer:  IV Team=IV Team collect   07/13/21 1110   07/17/21 0500  CBC with Differential/Platelet  Daily,   R     Question:  Specimen collection method  Answer:  IV Team=IV Team collect   07/16/21 1503   07/17/21 2482  Basic metabolic panel  Daily,   R     Question:  Specimen collection method  Answer:  IV Team=IV Team collect   07/16/21 1503   07/13/21 1358  Legionella Pneumophila Serogp 1 Ur Ag  Once,   R        07/13/21 1358            Signed, Terrilee Croak, MD [facility] 07/16/2021

## 2021-07-17 ENCOUNTER — Inpatient Hospital Stay (HOSPITAL_COMMUNITY): Payer: Medicare PPO

## 2021-07-17 DIAGNOSIS — J189 Pneumonia, unspecified organism: Secondary | ICD-10-CM

## 2021-07-17 LAB — CBC WITH DIFFERENTIAL/PLATELET
Abs Immature Granulocytes: 0.47 10*3/uL — ABNORMAL HIGH (ref 0.00–0.07)
Basophils Absolute: 0 10*3/uL (ref 0.0–0.1)
Basophils Relative: 0 %
Eosinophils Absolute: 0 10*3/uL (ref 0.0–0.5)
Eosinophils Relative: 0 %
HCT: 25 % — ABNORMAL LOW (ref 36.0–46.0)
Hemoglobin: 7.8 g/dL — ABNORMAL LOW (ref 12.0–15.0)
Immature Granulocytes: 2 %
Lymphocytes Relative: 2 %
Lymphs Abs: 0.4 10*3/uL — ABNORMAL LOW (ref 0.7–4.0)
MCH: 30.8 pg (ref 26.0–34.0)
MCHC: 31.2 g/dL (ref 30.0–36.0)
MCV: 98.8 fL (ref 80.0–100.0)
Monocytes Absolute: 0.7 10*3/uL (ref 0.1–1.0)
Monocytes Relative: 3 %
Neutro Abs: 24.8 10*3/uL — ABNORMAL HIGH (ref 1.7–7.7)
Neutrophils Relative %: 93 %
Platelets: 219 10*3/uL (ref 150–400)
RBC: 2.53 MIL/uL — ABNORMAL LOW (ref 3.87–5.11)
RDW: 17.1 % — ABNORMAL HIGH (ref 11.5–15.5)
WBC: 26.4 10*3/uL — ABNORMAL HIGH (ref 4.0–10.5)
nRBC: 0 % (ref 0.0–0.2)

## 2021-07-17 LAB — GLUCOSE, CAPILLARY
Glucose-Capillary: 116 mg/dL — ABNORMAL HIGH (ref 70–99)
Glucose-Capillary: 115 mg/dL — ABNORMAL HIGH (ref 70–99)
Glucose-Capillary: 127 mg/dL — ABNORMAL HIGH (ref 70–99)
Glucose-Capillary: 191 mg/dL — ABNORMAL HIGH (ref 70–99)

## 2021-07-17 LAB — CULTURE, BLOOD (ROUTINE X 2)
Culture: NO GROWTH
Culture: NO GROWTH
Special Requests: ADEQUATE

## 2021-07-17 LAB — BASIC METABOLIC PANEL
Anion gap: 7 (ref 5–15)
BUN: 21 mg/dL (ref 8–23)
CO2: 26 mmol/L (ref 22–32)
Calcium: 8.3 mg/dL — ABNORMAL LOW (ref 8.9–10.3)
Chloride: 107 mmol/L (ref 98–111)
Creatinine, Ser: 0.94 mg/dL (ref 0.44–1.00)
GFR, Estimated: >60 mL/min (ref >60)
Glucose, Bld: 112 mg/dL — ABNORMAL HIGH (ref 70–99)
Potassium: 3.3 mmol/L — ABNORMAL LOW (ref 3.5–5.1)
Sodium: 140 mmol/L (ref 135–145)

## 2021-07-17 MED ORDER — POTASSIUM CHLORIDE CRYS ER 20 MEQ PO TBCR
40.0000 meq | EXTENDED_RELEASE_TABLET | Freq: Once | ORAL | Status: AC
  Administered 2021-07-17: 40 meq via ORAL
  Filled 2021-07-17: qty 2

## 2021-07-17 MED ORDER — FUROSEMIDE 10 MG/ML IJ SOLN
40.0000 mg | Freq: Once | INTRAMUSCULAR | Status: AC
  Administered 2021-07-17: 40 mg via INTRAVENOUS
  Filled 2021-07-17: qty 4

## 2021-07-17 MED ORDER — LIDOCAINE HCL 1 % IJ SOLN
INTRAMUSCULAR | Status: AC
  Filled 2021-07-17: qty 20

## 2021-07-17 MED ORDER — IPRATROPIUM-ALBUTEROL 0.5-2.5 (3) MG/3ML IN SOLN
3.0000 mL | Freq: Three times a day (TID) | RESPIRATORY_TRACT | Status: DC
Start: 2021-07-17 — End: 2021-07-25
  Administered 2021-07-17 – 2021-07-24 (×20): 3 mL via RESPIRATORY_TRACT
  Filled 2021-07-17 (×21): qty 3

## 2021-07-17 NOTE — Evaluation (Signed)
Physical Therapy Evaluation Patient Details Name: Kelli Acosta MRN: [id number] DOB: 27-Aug-1956 Today's Date: 07/17/2021   History of Present Illness  65 yo female admitted with sepsis, back pain, acute on chronic respiratory failure. S/P thoracentesis 9/7. Hx of stage IV lung ca with mets, anemia, DM  Clinical Impression  On eval, pt required Min A for mobility. She took a few steps in the room to bsc and back using RW. O2 92% on 6L at rest, 82% on 6L with activity, 92% on 6L end of session. Discussed d/c plan-pt is visiting from out of town. She is planning to stay at her daughter's immediately after discharge. She eventually wants to return to her home in [place]. Will follow and progress activity as tolerated. Pt is requesting a rollator.     Follow Up Recommendations Home health PT;Supervision for mobility/OOB    Equipment Recommendations   (possibly 4 wheeled walker-continuing to assess)    Recommendations for Other Services       Precautions / Restrictions Precautions Precautions: Fall Precaution Comments: O2 dep at baseline Restrictions Weight Bearing Restrictions: No      Mobility  Bed Mobility Overal bed mobility: Needs Assistance Bed Mobility: Supine to Sit;Sit to Supine     Supine to sit: Supervision;HOB elevated Sit to supine: Supervision;HOB elevated   General bed mobility comments: Supv for lines, safety. Pt c/o mild lightheadedness    Transfers Overall transfer level: Needs assistance Equipment used: Rolling walker (2 wheeled) Transfers: Sit to/from Stand Sit to Stand: Min guard         General transfer comment: Min guard for safety. Cues for safety, hand placement. Increased time.  Ambulation/Gait Min Assist Gait Distance (Feet): 3 Feet Assistive device: Rolling walker (2 wheeled) Gait Pattern/deviations: Step-through pattern;Decreased stride length     General Gait Details: Took a few steps over to bsc using RW then back to bed. Increased  time. O2 82% on 6L.  Stairs            Wheelchair Mobility    Modified Rankin (Stroke Patients Only)       Balance Overall balance assessment: Needs assistance         Standing balance support: Bilateral upper extremity supported Standing balance-Leahy Scale: Fair                               Pertinent Vitals/Pain Pain Assessment: 0-10 Pain Score: 8  Pain Location: back Pain Descriptors / Indicators: Discomfort;Sore;Aching Pain Intervention(s): Limited activity within patient's tolerance;Monitored during session;Repositioned;RN gave pain meds during session    [place] expects to be discharged to:: Private residence Living Arrangements: Children Available Help at Discharge: Family Type of Home: House Home Access: Stairs to enter     Home Layout: One level Home Equipment: Wheelchair - manual      Prior Function Level of Independence: Independent with assistive device(s)         Comments: using wheelchair primarily to mobilize. walks short distances sometimes (around house only-had difficulty walking from bedroom to kitchen)     Hand Dominance        Extremity/Trunk Assessment   Upper Extremity Assessment Upper Extremity Assessment: Generalized weakness    Lower Extremity Assessment Lower Extremity Assessment: Generalized weakness    Cervical / Trunk Assessment Cervical / Trunk Assessment: Kyphotic  Communication   Communication: No difficulties  Cognition Arousal/Alertness: Awake/alert Behavior During Therapy: WFL for tasks assessed/performed Overall Cognitive Status: Within  Functional Limits for tasks assessed                                        General Comments      Exercises     Assessment/Plan    PT Assessment Patient needs continued PT services  PT Problem List Decreased strength;Decreased mobility;Decreased activity tolerance;Decreased balance;Decreased knowledge of use of  DME;Pain;Cardiopulmonary status limiting activity       PT Treatment Interventions DME instruction;Gait training;Therapeutic exercise;Balance training;Functional mobility training;Therapeutic activities;Patient/family education    PT Goals (Current goals can be found in the Care Plan section)  Acute Rehab PT Goals Patient Stated Goal: less pain. home soon. PT Goal Formulation: With patient/family Time For Goal Achievement: 07/31/21 Potential to Achieve Goals: Fair    Frequency Min 3X/week   Barriers to discharge        Co-evaluation               AM-PAC PT "6 Clicks" Mobility  Outcome Measure Help needed turning from your back to your side while in a flat bed without using bedrails?: A Little Help needed moving from lying on your back to sitting on the side of a flat bed without using bedrails?: A Little Help needed moving to and from a bed to a chair (including a wheelchair)?: A Little Help needed standing up from a chair using your arms (e.g., wheelchair or bedside chair)?: A Little Help needed to walk in hospital room?: A Lot Help needed climbing 3-5 steps with a railing? : A Lot 6 Click Score: 16    End of Session Equipment Utilized During Treatment: Oxygen Activity Tolerance: Patient limited by fatigue Patient left: in bed;with call bell/phone within reach;with family/visitor present   PT Visit Diagnosis: Unsteadiness on feet (R26.81);Difficulty in walking, not elsewhere classified (R26.2);Pain Pain - part of body:  (back)    Time: 3734-2876 PT Time Calculation (min) (ACUTE ONLY): 19 min   Charges:   PT Evaluation $PT Eval Moderate Complexity: 1 Mod           Doreatha Massed, PT Acute Rehabilitation  Office: [phone] Pager: [phone]

## 2021-07-17 NOTE — Progress Notes (Addendum)
ADM: SEPSIS CURRENT THERAPY: DUONEB QID, BREZTRI BIB, ALBUTEROL Q4 PRN PLAN: DUONEB TID, BREZTRI BID, ALBUTEROL Q4 PRN ASSESSMENT: NO WOB, NO SOB, SATS 0N 5 LPM [place] 95%, BS-DIM, NPC HOME REGIMEN: ALBUTEROL NEBS QID PRN, ALBUTEROL INHALER Q4 PRN, BREZRI BID RT CHANGED THERAPY DUE TO PT CONDITION.

## 2021-07-17 NOTE — Procedures (Signed)
PROCEDURE SUMMARY:  Successful US guided right thoracentesis. Yielded 700 ml of yellow fluid. Pt tolerated procedure well. No immediate complications.  CXR ordered; results pending  EBL < 2 mL  Theresa Duty, NP 07/17/2021 1:32 PM

## 2021-07-17 NOTE — Plan of Care (Signed)
  Problem: Respiratory: Goal: Ability to maintain adequate ventilation will improve Outcome: Progressing Goal: Ability to maintain a clear airway will improve Outcome: Progressing   Problem: Activity: Goal: Risk for activity intolerance will decrease Outcome: Progressing   Problem: Pain Managment: Goal: General experience of comfort will improve Outcome: Progressing   Problem: Safety: Goal: Ability to remain free from injury will improve Outcome: Progressing

## 2021-07-17 NOTE — Progress Notes (Addendum)
PROGRESS NOTE  Kelli Acosta  DOB: [birth year]  PCP: System, Provider Not In [id number]  DOA: 07/12/2021  LOS: 5 days  Hospital Day: 6   Chief Complaint  Patient presents with   Shortness of Breath   Fever    Brief narrative: Kelli Acosta is a 65 y.o. 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  Patient is from out of town, currently visiting her daughter and follows up with oncologist Dr. Ralene Bathe. Patient presented to the ED on 07/12/2021 with complaint of progressively worsening dyspnea for several days. CT scan obtained on admission showed multifocal pneumonia and hence was admitted to hospital service. Her respiratory status was improving however 9/4, she started to get worse and is currently on 6 L oxygen by nasal cannula.     Subjective: Patient was seen and examined this AM. Pleasant middle-aged Caucasian female.  Looks older for age.  Thin built. Patient still remains on 6 L oxygen by nasal cannula. Daughter at bedside.  We discussed about thoracentesis and patient is agreeable with the plan.  Assessment/Plan: Sepsis secondary to multifocal pneumonia in the setting of immunocompromise status -Currently on IV cefepime. -No growth in blood cultures so far. -Continue IV cefepime for now. -Leukocytosis could be also secondary to chronic prednisone use.  Continue to monitor Recent Labs  Lab 07/12/21 0628 07/12/21 0828 07/13/21 0418 07/13/21 1114 07/14/21 0356 07/15/21 0355 07/16/21 0400 07/17/21 0355  WBC 18.5*  --  16.4*  --  27.8* 34.4* 22.0* 26.4*  LATICACIDVEN 2.8* 2.6*  --  1.1  --   --   --   --     Acute on chronic respiratory failure with hypoxia -Chronically on 2 L oxygen by nasal cannula in the setting of COPD and stage IV lung cancer -I gave her Lasix 40 mg IV yesterday after which she made than 3 L of urine.  Feels a little better today but is still requiring 6 L at rest. -Chest x-ray  yesterday showed small to moderate bilateral pleural effusion.  I will repeat Lasix 40 mg IV 1 dose today and also obtain ultrasound thoracentesis today -Wean down submental oxygen.    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 Ralene Bathe [phone]) at [place] hometown.  Last radiation was in February 2022. -Prior to admission, patient was on prednisone 10 g daily.  Same has been resumed. -Continue bronchodilators.  Anemia of chronic disease -Macrocytic.  No active bleeding. Recent Labs    07/13/21 0418 07/14/21 0356 07/15/21 0355 07/16/21 0400 07/17/21 0355  HGB 8.6* 7.9* 8.4* 7.6* 7.8*  MCV 99.3 98.8 100.4* 98.0 98.8    Type 2 diabetes mellitus -A1c at 5.2 on 07/13/2021 -Currently on sliding scale insulin with Accu-Cheks. Recent Labs  Lab 07/16/21 0728 07/16/21 1149 07/16/21 1603 07/16/21 2200 07/17/21 0806  GLUCAP 102* 118* 175* 175* 116*   Hypokalemia -Probably secondary to diuresis.  Replacement ordered this morning.  Repeat level tomorrow. Recent Labs  Lab 07/12/21 0628 07/13/21 0418 07/14/21 0356 07/16/21 0400 07/17/21 0355  K 3.6 4.1 4.4 4.0 3.3*   Chronic constipation -Continue bowel regimen   Depression -continue Zoloft, Lunesta  Mobility: PT eval ordered Code Status:   Code Status: Full Code  Nutritional status: Body mass index is 26.13 kg/m. Nutrition Problem: Inadequate oral intake Etiology: nausea, vomiting Signs/Symptoms: per patient/family report Diet:  Diet Order  Diet Carb Modified Fluid consistency: Thin; Room service appropriate? Yes  Diet effective now                  DVT prophylaxis:  enoxaparin (LOVENOX) injection 40 mg Start: 07/13/21 1300 SCDs Start: 07/12/21 1032   Antimicrobials: IV cefepime Fluid: None Consultants: None Family Communication: Daughter Ms. Mardene Celeste at bedside  Status is: Inpatient  Remains inpatient appropriate because: Needs inpatient  monitoring, further diuresis  Dispo: The patient is from: Home              Anticipated d/c is to: Hopefully home in 1 to 2 days              Patient currently is not medically stable to d/c.   Difficult to place patient No     Infusions:   ceFEPime (MAXIPIME) IV 2 g (07/17/21 0538)    Scheduled Meds:  Budeson-Glycopyrrol-Formoterol  2 puff Inhalation BID   calcium carbonate  1 tablet Oral TID WC   Chlorhexidine Gluconate Cloth  6 each Topical Daily   cholecalciferol  1,000 Units Oral Daily   dexmethylphenidate  5 mg Oral BID   enoxaparin (LOVENOX) injection  40 mg Subcutaneous Q24H   feeding supplement (GLUCERNA SHAKE)  237 mL Oral TID BM   folic acid  1 mg Oral BID   guaiFENesin  600 mg Oral BID   insulin aspart  0-6 Units Subcutaneous TID WC   ipratropium-albuterol  3 mL Nebulization QID   multivitamin with minerals  1 tablet Oral Daily   pantoprazole  40 mg Oral Daily   polyethylene glycol  17 g Oral Daily   potassium chloride  40 mEq Oral Once   psyllium  1 packet Oral BID   senna  1 tablet Oral Daily   sertraline  100 mg Oral Daily   Vitamin E  400 Units Oral Daily    Antimicrobials: Anti-infectives (From admission, onward)    Start     Dose/Rate Route Frequency Ordered Stop   07/14/21 1400  ceFEPIme (MAXIPIME) 2 g in sodium chloride 0.9 % 100 mL IVPB        2 g 200 mL/hr over 30 Minutes Intravenous Every 8 hours 07/14/21 1056 07/19/21 0559   07/14/21 1200  vancomycin (VANCOCIN) IVPB 1000 mg/200 mL premix  Status:  Discontinued        1,000 mg 200 mL/hr over 60 Minutes Intravenous Every 24 hours 07/13/21 1037 07/14/21 1049   07/13/21 1130  vancomycin (VANCOREADY) IVPB 1500 mg/300 mL        1,500 mg 150 mL/hr over 120 Minutes Intravenous  Once 07/13/21 1036 07/13/21 1421   07/12/21 1800  ceFEPIme (MAXIPIME) 2 g in sodium chloride 0.9 % 100 mL IVPB  Status:  Discontinued        2 g 200 mL/hr over 30 Minutes Intravenous Every 12 hours 07/12/21 1049 07/14/21 1056    07/12/21 1045  ceFEPIme (MAXIPIME) 2 g in sodium chloride 0.9 % 100 mL IVPB  Status:  Discontinued        2 g 200 mL/hr over 30 Minutes Intravenous  Once 07/12/21 1031 07/12/21 1033   07/12/21 0645  ceFEPIme (MAXIPIME) 2 g in sodium chloride 0.9 % 100 mL IVPB        2 g 200 mL/hr over 30 Minutes Intravenous  Once 07/12/21 0633 07/12/21 0737       PRN meds: acetaminophen **OR** acetaminophen, albuterol, ALPRAZolam, Glycerin (Adult), guaiFENesin, hydrOXYzine, lip balm, ondansetron **OR** ondansetron (  ZOFRAN) IV, oxyCODONE, prochlorperazine, sodium chloride flush, zolpidem   Objective: Vitals:   07/17/21 0800 07/17/21 0820  BP:    Pulse:    Resp:    Temp:    SpO2: (!) 75% 91%    Intake/Output Summary (Last 24 hours) at 07/17/2021 1150 Last data filed at 07/17/2021 0930 Gross per 24 hour  Intake 720 ml  Output 4350 ml  Net -3630 ml    Filed Weights   07/12/21 0651  Weight: 71.2 kg   Weight change:  Body mass index is 26.13 kg/m.   Physical Exam: General exam: Pleasant, middle-aged Caucasian female.  Looks weak. Skin: No rashes, lesions or ulcers. HEENT: Atraumatic, normocephalic, no obvious bleeding Lungs: Mild end expiratory wheezing bilaterally.  No crackles CVS: Regular rate and rhythm, no murmur GI/Abd soft, nontender, nondistended, bowel sound present CNS: Alert, awake, oriented x3 Psychiatry: Depressed look Extremities: Trace bilateral pedal edema, no calf tenderness  Data Review: I have personally reviewed the laboratory data and studies available.  Recent Labs  Lab 07/12/21 0628 07/13/21 0418 07/14/21 0356 07/15/21 0355 07/16/21 0400 07/17/21 0355  WBC 18.5* 16.4* 27.8* 34.4* 22.0* 26.4*  NEUTROABS 16.5*  --   --   --   --  24.8*  HGB 9.8* 8.6* 7.9* 8.4* 7.6* 7.8*  HCT 31.2* 27.9* 25.5* 28.0* 24.5* 25.0*  MCV 97.8 99.3 98.8 100.4* 98.0 98.8  PLT 173 162 176 242 234 219    Recent Labs  Lab 07/12/21 0628 07/13/21 0418 07/14/21 0356  07/16/21 0400 07/17/21 0355  NA 137 140 142 139 140  K 3.6 4.1 4.4 4.0 3.3*  CL 101 107 112* 111 107  CO2 28 29 25 26 26   GLUCOSE 168* 116* 172* 109* 112*  BUN 15 18 18 22 21   CREATININE 1.00 1.18* 0.86 0.98 0.94  CALCIUM 9.6 8.9 8.6* 8.2* 8.3*     F/u labs ordered Unresulted Labs (From admission, onward)     Start     Ordered   07/20/21 0500  Creatinine, serum  (enoxaparin (LOVENOX)    CrCl >/= 30 ml/min)  Weekly,   R     Comments: while on enoxaparin therapy   Question:  Specimen collection method  Answer:  IV Team=IV Team collect   07/13/21 1110   07/17/21 0500  CBC with Differential/Platelet  Daily,   R     Question:  Specimen collection method  Answer:  IV Team=IV Team collect   07/16/21 1503   07/17/21 3361  Basic metabolic panel  Daily,   R     Question:  Specimen collection method  Answer:  IV Team=IV Team collect   07/16/21 1503            Signed, Terrilee Croak, MD [facility] 07/17/2021

## 2021-07-17 NOTE — Progress Notes (Signed)
Pharmacy Antibiotic Note  Kelli Acosta is a 65 y.o. female admitted on 07/12/2021 with pneumonia.  Pharmacy has been consulted for Cefepime dosing.  Today, 07/17/21 WBC remains elevated (on steroids chronically) Afebrile SCr stable  Today is day #6 of 7 Cefepime - stop date entered 9/8  Plan: Continue Cefepime to 2g IV q8h Monitor renal function, cultures, clinical course   Height: 5\' 5"  (165.1 cm) Weight: 71.2 kg (157 lb) IBW/kg (Calculated) : 57  Temp (24hrs), Avg:98.1 F (36.7 C), Min:97.7 F (36.5 C), Max:98.5 F (36.9 C)  Recent Labs  Lab 07/12/21 0628 07/12/21 0828 07/13/21 0418 07/13/21 1114 07/14/21 0356 07/15/21 0355 07/16/21 0400 07/17/21 0355  WBC 18.5*  --  16.4*  --  27.8* 34.4* 22.0* 26.4*  CREATININE 1.00  --  1.18*  --  0.86  --  0.98 0.94  LATICACIDVEN 2.8* 2.6*  --  1.1  --   --   --   --      Estimated Creatinine Clearance: 59.8 mL/min (by C-G formula based on SCr of 0.94 mg/dL).    Allergies  Allergen Reactions   Codeine Hives and Itching   Promethazine Other (See Comments)    jittery   Sulfa Antibiotics     Antimicrobials this admission: Cefepime 9/2 >>  Vancomycin 9/3 >> 9/4   Microbiology results: 9/2 BCx: ngtd 9/3 MRSA PCR: negative   Thank you for allowing pharmacy to be a part of this patient's care.   Adrian Saran, PharmD, BCPS Secure Chat if ?s 07/17/2021 11:01 AM

## 2021-07-18 DIAGNOSIS — A419 Sepsis, unspecified organism: Secondary | ICD-10-CM | POA: Diagnosis not present

## 2021-07-18 DIAGNOSIS — J9601 Acute respiratory failure with hypoxia: Secondary | ICD-10-CM | POA: Diagnosis not present

## 2021-07-18 DIAGNOSIS — R6520 Severe sepsis without septic shock: Secondary | ICD-10-CM | POA: Diagnosis not present

## 2021-07-18 LAB — CBC WITH DIFFERENTIAL/PLATELET
Abs Immature Granulocytes: 0.27 10*3/uL — ABNORMAL HIGH (ref 0.00–0.07)
Basophils Absolute: 0 10*3/uL (ref 0.0–0.1)
Basophils Relative: 0 %
Eosinophils Absolute: 0.1 10*3/uL (ref 0.0–0.5)
Eosinophils Relative: 0 %
HCT: 24.7 % — ABNORMAL LOW (ref 36.0–46.0)
Hemoglobin: 7.6 g/dL — ABNORMAL LOW (ref 12.0–15.0)
Immature Granulocytes: 1 %
Lymphocytes Relative: 2 %
Lymphs Abs: 0.5 10*3/uL — ABNORMAL LOW (ref 0.7–4.0)
MCH: 30.8 pg (ref 26.0–34.0)
MCHC: 30.8 g/dL (ref 30.0–36.0)
MCV: 100 fL (ref 80.0–100.0)
Monocytes Absolute: 0.7 10*3/uL (ref 0.1–1.0)
Monocytes Relative: 3 %
Neutro Abs: 21.4 10*3/uL — ABNORMAL HIGH (ref 1.7–7.7)
Neutrophils Relative %: 94 %
Platelets: 216 10*3/uL (ref 150–400)
RBC: 2.47 MIL/uL — ABNORMAL LOW (ref 3.87–5.11)
RDW: 17.2 % — ABNORMAL HIGH (ref 11.5–15.5)
WBC: 22.9 10*3/uL — ABNORMAL HIGH (ref 4.0–10.5)
nRBC: 0 % (ref 0.0–0.2)

## 2021-07-18 LAB — BASIC METABOLIC PANEL
Anion gap: 6 (ref 5–15)
BUN: 21 mg/dL (ref 8–23)
CO2: 29 mmol/L (ref 22–32)
Calcium: 8.3 mg/dL — ABNORMAL LOW (ref 8.9–10.3)
Chloride: 108 mmol/L (ref 98–111)
Creatinine, Ser: 0.92 mg/dL (ref 0.44–1.00)
GFR, Estimated: >60 mL/min (ref >60)
Glucose, Bld: 103 mg/dL — ABNORMAL HIGH (ref 70–99)
Potassium: 3.5 mmol/L (ref 3.5–5.1)
Sodium: 143 mmol/L (ref 135–145)

## 2021-07-18 LAB — GLUCOSE, CAPILLARY
Glucose-Capillary: 115 mg/dL — ABNORMAL HIGH (ref 70–99)
Glucose-Capillary: 165 mg/dL — ABNORMAL HIGH (ref 70–99)
Glucose-Capillary: 137 mg/dL — ABNORMAL HIGH (ref 70–99)
Glucose-Capillary: 141 mg/dL — ABNORMAL HIGH (ref 70–99)

## 2021-07-18 MED ORDER — MENTHOL 3 MG MT LOZG
1.0000 | LOZENGE | OROMUCOSAL | Status: DC | PRN
  Administered 2021-07-18: 3 mg via ORAL
  Filled 2021-07-18: qty 9

## 2021-07-18 MED ORDER — GLUCERNA SHAKE PO LIQD
237.0000 mL | Freq: Two times a day (BID) | ORAL | Status: DC
  Administered 2021-07-19 (×2): 237 mL via ORAL
  Filled 2021-07-18 (×5): qty 237

## 2021-07-18 MED ORDER — ENSURE ENLIVE PO LIQD: 237.0000 mL | ORAL | Status: DC

## 2021-07-18 MED ORDER — ALPRAZOLAM 1 MG PO TABS
1.0000 mg | ORAL_TABLET | Freq: Four times a day (QID) | ORAL | Status: DC | PRN
  Administered 2021-07-18 – 2021-07-24 (×21): 1 mg via ORAL
  Filled 2021-07-18 (×21): qty 1

## 2021-07-18 MED ORDER — FUROSEMIDE 10 MG/ML IJ SOLN
20.0000 mg | Freq: Once | INTRAMUSCULAR | Status: AC
  Administered 2021-07-18: 20 mg via INTRAVENOUS
  Filled 2021-07-18: qty 2

## 2021-07-18 NOTE — Progress Notes (Signed)
Nutrition Follow-up  DOCUMENTATION CODES:   Not applicable  INTERVENTION:  - will change Glucerna Shake from TID to BID, each supplement provides 220 kcal and 10 grams of protein. - will order Ensure Plus once/day, each supplement provides 350 kcal and 13 grams of protein. - weigh patient today.    NUTRITION DIAGNOSIS:   Inadequate oral intake related to nausea, vomiting as evidenced by per patient/family report. 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 Most recently, 100% of breakfast on 9/6 (700 kcal and 29 grams protein). Appetite has been improving since admission and she has been able to eat without discomfort or difficulty.   Patient has been accepting Glucerna Shakes 75% of the time offered. Will adjust order to provide an additional 130 kcal and 3 grams protein/day.   She has not been weighed since admission on 9/2, which appears to have been a stated weight. Non-pitting edema to BLE documented in the edema section of flow sheet; this is significantly decreased from the moderate to deep pitting edema documented on 9/6 night.  Yesterday she had R-sided thoracentesis with 700 ml yellow fluid removed. Overall, she is noted to be -6.1 L since admission.   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 Medications reviewed; 1000 units cholecalciferol/day, 1 mg folvite BID, 20 mg IV lasix x1 dose 9/8, sliding scale novolog, 1 tablet multivitamin with minerals/day, 40 mg oral protonix/day, 17 g miralax/day, 1 packet metamucil BID, 1 tablet senokot/day, 400 units vitamin E/day.    Diet Order:   Diet Order             Diet Carb Modified Fluid consistency: Thin; Room service appropriate? Yes  Diet effective now                   EDUCATION NEEDS:   No education needs have been identified at this time  Skin:  Skin Assessment: Reviewed RN Assessment  Last BM:  9/5 (type 7)  Height:   Ht Readings from Last 1 Encounters:  07/12/21 5\' 5"  (1.651 m)    Weight:   Wt Readings from Last 1 Encounters:  07/12/21 71.2 kg      Estimated Nutritional Needs:  Kcal:  1800-2000 Protein:  90-100 grams Fluid:  >1.8L     Jarome Matin, MS, RD, LDN, CNSC Inpatient Clinical Dietitian RD pager # available in [facility]  After hours/weekend pager # available in [facility]

## 2021-07-18 NOTE — Progress Notes (Signed)
I tried to walk with pt, but once pt stood up (very slow), her O2 dropped to 87 and she had dyspnea and stated she was dizzy so I had to sit her back down on the bed and lay her back. I also had to give her Zofran and a tylenol because she stated he head was hurting.

## 2021-07-18 NOTE — Progress Notes (Signed)
PHARMACY NOTE -  Cefepime  Pharmacy has been assisting with dosing of Cefepime for PNA. Dosage remains stable at 2g IV q8 hr and further renal adjustments per institutional Pharmacy antibiotic protocol Stop date set for 9/10 (7d)  Pharmacy will sign off, following peripherally for culture results or dose adjustments. Please reconsult if a change in clinical status warrants re-evaluation of dosage.  Reuel Boom, PharmD, BCPS [phone] 07/18/2021, 1:52 PM

## 2021-07-18 NOTE — Care Management Important Message (Signed)
Important Message  Patient Details IM Letter given to the Patient. Name: Kelli Acosta MRN: [id number] Date of Birth: 01-Mar-1956   Medicare Important Message Given:  Yes     Kerin Salen 07/18/2021, 11:22 AM

## 2021-07-18 NOTE — Progress Notes (Signed)
PROGRESS NOTE  Kelli Acosta  DOB: 12-10-1955  PCP: System, Provider Not In [id number]  DOA: 07/12/2021  LOS: 6 days  Hospital Day: 7   Chief Complaint  Patient presents with   Shortness of Breath   Fever    Brief narrative: Kelli Acosta is a 65 y.o. 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  Patient is from out of town, currently visiting her daughter and follows up with oncologist Dr. Ralene Bathe. Patient presented to the ED on 07/12/2021 with complaint of progressively worsening dyspnea for several days. CT scan obtained on admission showed multifocal pneumonia and hence was admitted to hospital service. Her respiratory status was improving however 9/4, she started to get worse and is currently on 6 L oxygen by nasal cannula.     Subjective: Patient was seen and examined this AM. Feels somewhat better after thoracentesis yesterday but is still remains on 4 L oxygen by nasal cannula.  Assessment/Plan: Sepsis secondary to multifocal pneumonia in the setting of immunocompromise status -Currently on IV cefepime. -No fever.  WBC count remains elevated probably contributed by prednisone.  Lactic acid level normalized. -No growth in blood cultures so far.  Chest x-ray from 9/7 showed bilateral pneumonia with mild improvement in right upper lobe aeration. -Continue IV cefepime for now. Recent Labs  Lab 07/12/21 0628 07/12/21 0828 07/13/21 0418 07/13/21 1114 07/14/21 0356 07/15/21 0355 07/16/21 0400 07/17/21 0355 07/18/21 0315  WBC 18.5*  --    < >  --  27.8* 34.4* 22.0* 26.4* 22.9*  LATICACIDVEN 2.8* 2.6*  --  1.1  --   --   --   --   --    < > = values in this interval not displayed.    Acute on chronic respiratory failure with hypoxia -Chronically on 2 L oxygen by nasal cannula in the setting of COPD and stage IV lung cancer -Acute exacerbation probably secondary to pneumonia and also contributed by pulm  edema and pleural effusion.   -She had made significant urine output with Lasix 40 mg IV for last 2 days.  She also had 700 mL of thoracentesis done on the right side yesterday.  Continues to remain dyspneic and requires 4 L oxygen by nasal cannula at rest.  I gave her Lasix 20 mg IV 1 dose this morning.  If continues to remain symptomatic, may need to consider left thoracentesis as well tomorrow.   -Check ambulatory oxygen requirement. -Wean down submental oxygen as tolerated.. -Encourage incentive spirometry.  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 Ralene Bathe [phone]) at [place] hometown.  Last radiation was in February 2022. -Prior to admission, patient was on prednisone 10 g daily.  Same has been resumed. -Continue bronchodilators.  Anemia of chronic disease -Macrocytic.  No active bleeding. Recent Labs    07/14/21 0356 07/15/21 0355 07/16/21 0400 07/17/21 0355 07/18/21 0315  HGB 7.9* 8.4* 7.6* 7.8* 7.6*  MCV 98.8 100.4* 98.0 98.8 100.0    Type 2 diabetes mellitus -A1c at 5.2 on 07/13/2021 -Currently on sliding scale insulin with Accu-Cheks. Recent Labs  Lab 07/17/21 1204 07/17/21 1640 07/17/21 2126 07/18/21 0802 07/18/21 1058  GLUCAP 115* 127* 191* 115* 165*   Hypokalemia -Improved with replacement. Recent Labs  Lab 07/13/21 0418 07/14/21 0356 07/16/21 0400 07/17/21 0355 07/18/21 0315  K 4.1 4.4 4.0 3.3* 3.5    Chronic constipation -Continue bowel  regimen   Depression -continue Zoloft, Lunesta  Mobility: PT eval obtained.  Home health PT recommended Code Status:   Code Status: Full Code  Nutritional status: Body mass index is 26.13 kg/m. Nutrition Problem: Inadequate oral intake Etiology: nausea, vomiting Signs/Symptoms: per patient/family report Diet:  Diet Order             Diet Carb Modified Fluid consistency: Thin; Room service appropriate? Yes  Diet effective now                  DVT  prophylaxis:  enoxaparin (LOVENOX) injection 40 mg Start: 07/13/21 1300 SCDs Start: 07/12/21 1032   Antimicrobials: IV cefepime Fluid: None Consultants: None Family Communication: Daughter Ms. Mardene Celeste not at bedside today  Status is: Inpatient  Remains inpatient appropriate because: Needs inpatient monitoring, may need further diuresis and thoracentesis  Dispo: The patient is from: Home              Anticipated d/c is to: Hopefully home with home health PT in 1 to 2 days              Patient currently is not medically stable to d/c.   Difficult to place patient No     Infusions:   ceFEPime (MAXIPIME) IV 2 g (07/18/21 1454)    Scheduled Meds:  Budeson-Glycopyrrol-Formoterol  2 puff Inhalation BID   calcium carbonate  1 tablet Oral TID WC   Chlorhexidine Gluconate Cloth  6 each Topical Daily   cholecalciferol  1,000 Units Oral Daily   dexmethylphenidate  5 mg Oral BID   enoxaparin (LOVENOX) injection  40 mg Subcutaneous Q24H   [START ON 07/19/2021] feeding supplement  237 mL Oral Q24H   feeding supplement (GLUCERNA SHAKE)  237 mL Oral BID BM   folic acid  1 mg Oral BID   guaiFENesin  600 mg Oral BID   insulin aspart  0-6 Units Subcutaneous TID WC   ipratropium-albuterol  3 mL Nebulization TID   multivitamin with minerals  1 tablet Oral Daily   pantoprazole  40 mg Oral Daily   polyethylene glycol  17 g Oral Daily   psyllium  1 packet Oral BID   senna  1 tablet Oral Daily   sertraline  100 mg Oral Daily   Vitamin E  400 Units Oral Daily    Antimicrobials: Anti-infectives (From admission, onward)    Start     Dose/Rate Route Frequency Ordered Stop   07/14/21 1400  ceFEPIme (MAXIPIME) 2 g in sodium chloride 0.9 % 100 mL IVPB        2 g 200 mL/hr over 30 Minutes Intravenous Every 8 hours 07/14/21 1056 07/18/21 2359   07/14/21 1200  vancomycin (VANCOCIN) IVPB 1000 mg/200 mL premix  Status:  Discontinued        1,000 mg 200 mL/hr over 60 Minutes Intravenous Every 24  hours 07/13/21 1037 07/14/21 1049   07/13/21 1130  vancomycin (VANCOREADY) IVPB 1500 mg/300 mL        1,500 mg 150 mL/hr over 120 Minutes Intravenous  Once 07/13/21 1036 07/13/21 1421   07/12/21 1800  ceFEPIme (MAXIPIME) 2 g in sodium chloride 0.9 % 100 mL IVPB  Status:  Discontinued        2 g 200 mL/hr over 30 Minutes Intravenous Every 12 hours 07/12/21 1049 07/14/21 1056   07/12/21 1045  ceFEPIme (MAXIPIME) 2 g in sodium chloride 0.9 % 100 mL IVPB  Status:  Discontinued  2 g 200 mL/hr over 30 Minutes Intravenous  Once 07/12/21 1031 07/12/21 1033   07/12/21 0645  ceFEPIme (MAXIPIME) 2 g in sodium chloride 0.9 % 100 mL IVPB        2 g 200 mL/hr over 30 Minutes Intravenous  Once 07/12/21 0633 07/12/21 0737       PRN meds: acetaminophen **OR** acetaminophen, albuterol, ALPRAZolam, Glycerin (Adult), guaiFENesin, hydrOXYzine, lip balm, ondansetron **OR** ondansetron (ZOFRAN) IV, oxyCODONE, prochlorperazine, sodium chloride flush, zolpidem   Objective: Vitals:   07/18/21 0757 07/18/21 1427  BP:  134/62  Pulse:  91  Resp:    Temp:    SpO2: 95% 95%    Intake/Output Summary (Last 24 hours) at 07/18/2021 1504 Last data filed at 07/18/2021 1147 Gross per 24 hour  Intake 120 ml  Output 1650 ml  Net -1530 ml    Filed Weights   07/12/21 0651  Weight: 71.2 kg   Weight change:  Body mass index is 26.13 kg/m.   Physical Exam: General exam: Pleasant, middle-aged Caucasian female.  Looks weak.  Tachypneic Skin: No rashes, lesions or ulcers. HEENT: Atraumatic, normocephalic, no obvious bleeding Lungs: Mild end expiratory wheezing bilaterally.  Coughs on deep breathing. CVS: Regular rate and rhythm, no murmur GI/Abd soft, nontender, nondistended, bowel sound present CNS: Alert, awake, oriented x3 Psychiatry: Depressed look Extremities: Trace bilateral pedal edema, no calf tenderness  Data Review: I have personally reviewed the laboratory data and studies available.  Recent  Labs  Lab 07/12/21 0628 07/13/21 0418 07/14/21 0356 07/15/21 0355 07/16/21 0400 07/17/21 0355 07/18/21 0315  WBC 18.5*   < > 27.8* 34.4* 22.0* 26.4* 22.9*  NEUTROABS 16.5*  --   --   --   --  24.8* 21.4*  HGB 9.8*   < > 7.9* 8.4* 7.6* 7.8* 7.6*  HCT 31.2*   < > 25.5* 28.0* 24.5* 25.0* 24.7*  MCV 97.8   < > 98.8 100.4* 98.0 98.8 100.0  PLT 173   < > 176 242 234 219 216   < > = values in this interval not displayed.    Recent Labs  Lab 07/13/21 0418 07/14/21 0356 07/16/21 0400 07/17/21 0355 07/18/21 0315  NA 140 142 139 140 143  K 4.1 4.4 4.0 3.3* 3.5  CL 107 112* 111 107 108  CO2 29 25 26 26 29   GLUCOSE 116* 172* 109* 112* 103*  BUN 18 18 22 21 21   CREATININE 1.18* 0.86 0.98 0.94 0.92  CALCIUM 8.9 8.6* 8.2* 8.3* 8.3*     F/u labs ordered Unresulted Labs (From admission, onward)     Start     Ordered   07/20/21 0500  Creatinine, serum  (enoxaparin (LOVENOX)    CrCl >/= 30 ml/min)  Weekly,   R     Comments: while on enoxaparin therapy   Question:  Specimen collection method  Answer:  IV Team=IV Team collect   07/13/21 1110   07/17/21 0500  CBC with Differential/Platelet  Daily,   R     Question:  Specimen collection method  Answer:  IV Team=IV Team collect   07/16/21 1503   07/17/21 2536  Basic metabolic panel  Daily,   R     Question:  Specimen collection method  Answer:  IV Team=IV Team collect   07/16/21 1503            Signed, Terrilee Croak, MD [facility] 07/18/2021

## 2021-07-19 ENCOUNTER — Inpatient Hospital Stay (HOSPITAL_COMMUNITY): Payer: Medicare PPO

## 2021-07-19 DIAGNOSIS — J189 Pneumonia, unspecified organism: Secondary | ICD-10-CM | POA: Diagnosis not present

## 2021-07-19 LAB — GLUCOSE, CAPILLARY
Glucose-Capillary: 111 mg/dL — ABNORMAL HIGH (ref 70–99)
Glucose-Capillary: 122 mg/dL — ABNORMAL HIGH (ref 70–99)
Glucose-Capillary: 143 mg/dL — ABNORMAL HIGH (ref 70–99)
Glucose-Capillary: 180 mg/dL — ABNORMAL HIGH (ref 70–99)

## 2021-07-19 LAB — BASIC METABOLIC PANEL
Anion gap: 6 (ref 5–15)
BUN: 23 mg/dL (ref 8–23)
CO2: 29 mmol/L (ref 22–32)
Calcium: 8 mg/dL — ABNORMAL LOW (ref 8.9–10.3)
Chloride: 102 mmol/L (ref 98–111)
Creatinine, Ser: 0.93 mg/dL (ref 0.44–1.00)
GFR, Estimated: >60 mL/min (ref >60)
Glucose, Bld: 121 mg/dL — ABNORMAL HIGH (ref 70–99)
Potassium: 3.3 mmol/L — ABNORMAL LOW (ref 3.5–5.1)
Sodium: 137 mmol/L (ref 135–145)

## 2021-07-19 LAB — CBC WITH DIFFERENTIAL/PLATELET
Abs Immature Granulocytes: 0.18 10*3/uL — ABNORMAL HIGH (ref 0.00–0.07)
Basophils Absolute: 0 10*3/uL (ref 0.0–0.1)
Basophils Relative: 0 %
Eosinophils Absolute: 0.1 10*3/uL (ref 0.0–0.5)
Eosinophils Relative: 1 %
HCT: 24 % — ABNORMAL LOW (ref 36.0–46.0)
Hemoglobin: 7.3 g/dL — ABNORMAL LOW (ref 12.0–15.0)
Immature Granulocytes: 1 %
Lymphocytes Relative: 3 %
Lymphs Abs: 0.4 10*3/uL — ABNORMAL LOW (ref 0.7–4.0)
MCH: 30.5 pg (ref 26.0–34.0)
MCHC: 30.4 g/dL (ref 30.0–36.0)
MCV: 100.4 fL — ABNORMAL HIGH (ref 80.0–100.0)
Monocytes Absolute: 0.5 10*3/uL (ref 0.1–1.0)
Monocytes Relative: 4 %
Neutro Abs: 12.9 10*3/uL — ABNORMAL HIGH (ref 1.7–7.7)
Neutrophils Relative %: 91 %
Platelets: 186 10*3/uL (ref 150–400)
RBC: 2.39 MIL/uL — ABNORMAL LOW (ref 3.87–5.11)
RDW: 16.6 % — ABNORMAL HIGH (ref 11.5–15.5)
WBC: 14.1 10*3/uL — ABNORMAL HIGH (ref 4.0–10.5)
nRBC: 0 % (ref 0.0–0.2)

## 2021-07-19 MED ORDER — POTASSIUM CHLORIDE CRYS ER 20 MEQ PO TBCR
40.0000 meq | EXTENDED_RELEASE_TABLET | Freq: Once | ORAL | Status: AC
  Administered 2021-07-19: 40 meq via ORAL
  Filled 2021-07-19: qty 2

## 2021-07-19 MED ORDER — LIDOCAINE HCL 1 % IJ SOLN
INTRAMUSCULAR | Status: AC
  Filled 2021-07-19: qty 20

## 2021-07-19 NOTE — Progress Notes (Signed)
PROGRESS NOTE  Kelli Acosta  DOB: [birth year]  PCP: System, Provider Not In [id number]  DOA: 07/12/2021  LOS: 7 days  Hospital Day: 8   Chief Complaint  Patient presents with   Shortness of Breath   Fever    Brief narrative: Kelli Acosta is a 65 y.o. 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  Patient is from out of town, currently visiting her daughter and follows up with oncologist Dr. Ralene Bathe. Patient presented to the ED on 07/12/2021 with complaint of progressively worsening dyspnea for several days. CT scan obtained on admission showed multifocal pneumonia and hence was admitted to hospital service. Her respiratory status was improving however 9/4, she started to get worse and is currently on 6 L oxygen by nasal cannula.     Subjective: Patient was seen and examined this AM. It [birth year] today. Lying in bed.  On 4 L oxygen by nasal cannula.  Pending left thoracentesis today.  Coughs violently on deep breathing.  Assessment/Plan: Sepsis secondary to multifocal pneumonia in the setting of immunocompromise status -Currently on IV cefepime. -No fever.  WBC count remains elevated probably contributed by prednisone.  Lactic acid level normalized. -No growth in blood cultures so far.  Chest x-ray from 9/7 showed bilateral pneumonia with mild improvement in right upper lobe aeration. -Continue IV cefepime for now. Recent Labs  Lab 07/13/21 1114 07/14/21 0356 07/15/21 0355 07/16/21 0400 07/17/21 0355 07/18/21 0315 07/19/21 0512  WBC  --    < > 34.4* 22.0* 26.4* 22.9* 14.1*  LATICACIDVEN 1.1  --   --   --   --   --   --    < > = values in this interval not displayed.   Acute on chronic respiratory failure with hypoxia -Chronically on 2 L oxygen by nasal cannula in the setting of COPD and stage IV lung cancer -Acute respiratory decompensation was probably from a combination of pneumonia, pulm edema and  pleural effusion.  -With IV Lasix, patient has had significant diuresis to a point where she is beginning to have orthostatic blood pressure drop.  No further diuresis at this point. -On 9/7, patient underwent thoracentesis of the right with removal of 700 mill of pleural fluid.  Continues to remain on 4 L oxygen despite that.  Ordered for left-sided thoracentesis today.  Wean down oxygen as tolerated. -Check ambulatory oxygen requirement. -Encouraged to use incentive spirometry.  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 Ralene Bathe [phone]) at [place] hometown.  Last radiation was in February 2022. -Prior to admission, patient was on prednisone 10 g daily.  Continue the same. -Continue bronchodilators.  Anemia of chronic disease -Macrocytic.  No active bleeding. Recent Labs    07/15/21 0355 07/16/21 0400 07/17/21 0355 07/18/21 0315 07/19/21 0512  HGB 8.4* 7.6* 7.8* 7.6* 7.3*  MCV 100.4* 98.0 98.8 100.0 100.4*   Type 2 diabetes mellitus -A1c at 5.2 on 07/13/2021 -Currently on sliding scale insulin with Accu-Cheks. Recent Labs  Lab 07/18/21 1058 07/18/21 1702 07/18/21 2208 07/19/21 0752 07/19/21 1053  GLUCAP 165* 137* 141* 111* 122*  Hypokalemia -Improved with replacement. Recent Labs  Lab 07/14/21 0356 07/16/21 0400 07/17/21 0355 07/18/21 0315 07/19/21 0512  K 4.4 4.0 3.3* 3.5 3.3*   Chronic constipation -Continue bowel regimen   Depression -continue Zoloft, Lunesta  Mobility: PT eval obtained.  Home health PT recommended  Code Status:   Code Status: Full Code  Nutritional status: Body mass index is 27.37 kg/m. Nutrition Problem: Inadequate oral intake Etiology: nausea, vomiting Signs/Symptoms: per patient/family report Diet:  Diet Order             Diet Carb Modified Fluid consistency: Thin; Room service appropriate? Yes  Diet effective now                  DVT prophylaxis:  enoxaparin (LOVENOX) injection  40 mg Start: 07/13/21 1300 SCDs Start: 07/12/21 1032   Antimicrobials: IV cefepime Fluid: None Consultants: None Family Communication: Daughter Ms. Mardene Celeste not at bedside today  Status is: Inpatient  Remains inpatient appropriate because: Continues to require high flow oxygen even at rest.  Pending thoracentesis. Dispo: The patient is from: Home              Anticipated d/c is to: Hopefully home with home health PT in 1 to 2 days              Patient currently is not medically stable to d/c.   Difficult to place patient No     Infusions:     Scheduled Meds:  Budeson-Glycopyrrol-Formoterol  2 puff Inhalation BID   calcium carbonate  1 tablet Oral TID WC   Chlorhexidine Gluconate Cloth  6 each Topical Daily   cholecalciferol  1,000 Units Oral Daily   dexmethylphenidate  5 mg Oral BID   enoxaparin (LOVENOX) injection  40 mg Subcutaneous Q24H   feeding supplement  237 mL Oral Q24H   feeding supplement (GLUCERNA SHAKE)  237 mL Oral BID BM   folic acid  1 mg Oral BID   guaiFENesin  600 mg Oral BID   insulin aspart  0-6 Units Subcutaneous TID WC   ipratropium-albuterol  3 mL Nebulization TID   multivitamin with minerals  1 tablet Oral Daily   pantoprazole  40 mg Oral Daily   polyethylene glycol  17 g Oral Daily   psyllium  1 packet Oral BID   senna  1 tablet Oral Daily   sertraline  100 mg Oral Daily   Vitamin E  400 Units Oral Daily    Antimicrobials: Anti-infectives (From admission, onward)    Start     Dose/Rate Route Frequency Ordered Stop   07/14/21 1400  ceFEPIme (MAXIPIME) 2 g in sodium chloride 0.9 % 100 mL IVPB        2 g 200 mL/hr over 30 Minutes Intravenous Every 8 hours 07/14/21 1056 07/18/21 2310   07/14/21 1200  vancomycin (VANCOCIN) IVPB 1000 mg/200 mL premix  Status:  Discontinued        1,000 mg 200 mL/hr over 60 Minutes Intravenous Every 24 hours 07/13/21 1037 07/14/21 1049   07/13/21 1130  vancomycin (VANCOREADY) IVPB 1500 mg/300 mL        1,500  mg 150 mL/hr over 120 Minutes Intravenous  Once 07/13/21 1036 07/13/21 1421   07/12/21 1800  ceFEPIme (MAXIPIME) 2 g in sodium chloride 0.9 % 100 mL IVPB  Status:  Discontinued        2 g 200 mL/hr over 30 Minutes Intravenous Every 12 hours 07/12/21 1049 07/14/21 1056   07/12/21 1045  ceFEPIme (MAXIPIME) 2 g in sodium chloride 0.9 % 100 mL IVPB  Status:  Discontinued        2 g 200 mL/hr over 30 Minutes Intravenous  Once 07/12/21 1031 07/12/21 1033   07/12/21 0645  ceFEPIme (MAXIPIME) 2 g in sodium  chloride 0.9 % 100 mL IVPB        2 g 200 mL/hr over 30 Minutes Intravenous  Once 07/12/21 0633 07/12/21 0737       PRN meds: acetaminophen **OR** acetaminophen, albuterol, ALPRAZolam, Glycerin (Adult), guaiFENesin, hydrOXYzine, lip balm, menthol-cetylpyridinium, ondansetron **OR** ondansetron (ZOFRAN) IV, oxyCODONE, prochlorperazine, sodium chloride flush, zolpidem   Objective: Vitals:   07/19/21 0750 07/19/21 1245  BP:  (!) 116/59  Pulse:  98  Resp:  20  Temp:  98.3 F (36.8 C)  SpO2: 95% 95%    Intake/Output Summary (Last 24 hours) at 07/19/2021 1427 Last data filed at 07/19/2021 0720 Gross per 24 hour  Intake 480 ml  Output 1400 ml  Net -920 ml   Filed Weights   07/12/21 0651 07/18/21 1643  Weight: 71.2 kg 74.6 kg   Weight change:  Body mass index is 27.37 kg/m.   Physical Exam: General exam: Pleasant, middle-aged Caucasian female.  Looks weak.  Tachypneic. Skin: No rashes, lesions or ulcers. HEENT: Atraumatic, normocephalic, no obvious bleeding Lungs: Mild end expiratory wheezing bilaterally.  Cough significantly deep breathing CVS: Regular rate and rhythm, no murmur GI/Abd soft, nontender, nondistended, bowel sound present CNS: Alert, awake, oriented x3 Psychiatry: Depressed look Extremities: Trace bilateral pedal edema, no calf tenderness  Data Review: I have personally reviewed the laboratory data and studies available.  Recent Labs  Lab 07/15/21 0355  07/16/21 0400 07/17/21 0355 07/18/21 0315 07/19/21 0512  WBC 34.4* 22.0* 26.4* 22.9* 14.1*  NEUTROABS  --   --  24.8* 21.4* 12.9*  HGB 8.4* 7.6* 7.8* 7.6* 7.3*  HCT 28.0* 24.5* 25.0* 24.7* 24.0*  MCV 100.4* 98.0 98.8 100.0 100.4*  PLT 242 234 219 216 186   Recent Labs  Lab 07/14/21 0356 07/16/21 0400 07/17/21 0355 07/18/21 0315 07/19/21 0512  NA 142 139 140 143 137  K 4.4 4.0 3.3* 3.5 3.3*  CL 112* 111 107 108 102  CO2 25 26 26 29 29   GLUCOSE 172* 109* 112* 103* 121*  BUN 18 22 21 21 23   CREATININE 0.86 0.98 0.94 0.92 0.93  CALCIUM 8.6* 8.2* 8.3* 8.3* 8.0*    F/u labs ordered Unresulted Labs (From admission, onward)     Start     Ordered   07/20/21 0500  Creatinine, serum  (enoxaparin (LOVENOX)    CrCl >/= 30 ml/min)  Weekly,   R     Comments: while on enoxaparin therapy   Question:  Specimen collection method  Answer:  IV Team=IV Team collect   07/13/21 1110            Signed, Terrilee Croak, MD [facility] 07/19/2021

## 2021-07-19 NOTE — Procedures (Signed)
PROCEDURE SUMMARY:  Successful US guided left thoracentesis. Yielded 400 mL of clear yellow fluid. Pt tolerated procedure well. No immediate complications.  Specimen was not sent for labs. CXR ordered.  EBL < 5 mL  Ascencion Dike PA-C 07/19/2021 4:52 PM

## 2021-07-19 NOTE — Progress Notes (Signed)
Physical Therapy Treatment Patient Details Name: Kelli Acosta MRN: [id number] DOB: 07-25-1956 Today's Date: 07/19/2021    History of Present Illness 65 yo female admitted with sepsis, back pain, acute on chronic respiratory failure. S/P thoracentesis 9/7. Hx of stage IV lung ca with mets, anemia, DM    PT Comments    Pt in bed on 5 lts.  Assisted OOB to [facility] was limiting.  General bed mobility comments: Supv for lines, safety. Pt c/o mild lightheadedness/fatigue.  General transfer comment: Min guard for safety. Cues for safety, hand placement. Increased time.  Assisted to [facility].  General Gait Details: Took a few steps over to bsc using RW then back to bed. Increased time. O2 82% on 6L.  4/4 dyspnea Assisted back to bed.  Too fatigued to attempt amb.    Follow Up Recommendations  Home health PT;Supervision for mobility/OOB     Equipment Recommendations       Recommendations for Other Services       Precautions / Restrictions Precautions Precautions: Fall Precaution Comments: O2 dep at baseline    Mobility  Bed Mobility Overal bed mobility: Needs Assistance Bed Mobility: Supine to Sit;Sit to Supine     Supine to sit: Supervision;HOB elevated Sit to supine: HOB elevated;Min guard;Min assist   General bed mobility comments: Supv for lines, safety. Pt c/o mild lightheadedness/fatigue    Transfers Overall transfer level: Needs assistance Equipment used: Rolling walker (2 wheeled) Transfers: Sit to/from [facility] Sit to Stand: Min guard Stand pivot transfers: Min guard;Min assist       General transfer comment: Min guard for safety. Cues for safety, hand placement. Increased time.  Assisted to [facility]  Ambulation/Gait Ambulation/Gait assistance: Min assist;Min guard Gait Distance (Feet): 2 Feet Assistive device: Rolling walker (2 wheeled) Gait Pattern/deviations: Step-through pattern;Decreased stride length Gait velocity: decreased   General Gait  Details: Took a few steps over to bsc using RW then back to bed. Increased time. O2 82% on 6L.  4/4 dyspnea   Stairs             Wheelchair Mobility    Modified Rankin (Stroke Patients Only)       Balance                                            Cognition Arousal/Alertness: Awake/alert Behavior During Therapy: WFL for tasks assessed/performed Overall Cognitive Status: Within Functional Limits for tasks assessed                                 General Comments: AxO x 3 very sweet lady but also very fragile      Exercises      General Comments        Pertinent Vitals/Pain Pain Assessment: Faces Faces Pain Scale: Hurts little more Pain Location: back Chronic Pain Descriptors / Indicators: Discomfort;Sore;Aching Pain Intervention(s): Heat applied;Monitored during session;Repositioned    Home Living                      Prior Function            PT Goals (current goals can now be found in the care plan section) Progress towards PT goals: Progressing toward goals    Frequency    Min 3X/week  PT Plan Current plan remains appropriate    Co-evaluation              AM-PAC PT "6 Clicks" Mobility   Outcome Measure  Help needed turning from your back to your side while in a flat bed without using bedrails?: A Little Help needed moving from lying on your back to sitting on the side of a flat bed without using bedrails?: A Little Help needed moving to and from a bed to a chair (including a wheelchair)?: A Little Help needed standing up from a chair using your arms (e.g., wheelchair or bedside chair)?: A Little Help needed to walk in hospital room?: A Lot Help needed climbing 3-5 steps with a railing? : Total 6 Click Score: 15    End of Session Equipment Utilized During Treatment: Oxygen;Gait belt Activity Tolerance: Patient limited by fatigue Patient left: in bed;with call bell/phone within  reach;with family/visitor present   PT Visit Diagnosis: Unsteadiness on feet (R26.81);Difficulty in walking, not elsewhere classified (R26.2);Pain     Time: 1002-1020 PT Time Calculation (min) (ACUTE ONLY): 18 min  Charges:  $Therapeutic Activity: 8-22 mins                     {Aiyanah Kalama  PTA Acute  Rehabilitation Services Pager      [phone] Office      [phone]

## 2021-07-19 NOTE — TOC Progression Note (Signed)
Transition of Care [facility]) - Progression Note    Patient Details  Name: Lucia Harm MRN: [id number] Date of Birth: [birth year]  Transition of Care [facility]) CM/SW Contact  Purcell Mouton, RN Phone Number: 07/19/2021, 12:59 PM  Clinical Narrative:     Pt states call my daughter Mardene Celeste concerning [place] and DME. Pt will go live with her daughter  [address], [place], [place] [address]. Her O2 is at daughter's home, she is with [facility] for O2. Encouraged daughter to call that company for more tanks, Mardene Celeste states that concentrator is at her home. [facility] was selected for [facility] needs and [employer] for DME. Referral given.   Expected Discharge Plan: [place] Barriers to Discharge: No Barriers Identified  Expected Discharge Plan and Services Expected Discharge Plan: [place] arrangements for the past 2 months: Apartment                                       Social Determinants of Health (SDOH) Interventions    Readmission Risk Interventions No flowsheet data found.

## 2021-07-20 DIAGNOSIS — J189 Pneumonia, unspecified organism: Secondary | ICD-10-CM | POA: Diagnosis not present

## 2021-07-20 DIAGNOSIS — J9601 Acute respiratory failure with hypoxia: Secondary | ICD-10-CM | POA: Diagnosis not present

## 2021-07-20 LAB — RESPIRATORY PANEL BY PCR

## 2021-07-20 LAB — PROCALCITONIN: Procalcitonin: 0.29 ng/mL

## 2021-07-20 LAB — GLUCOSE, CAPILLARY
Glucose-Capillary: 102 mg/dL — ABNORMAL HIGH (ref 70–99)
Glucose-Capillary: 158 mg/dL — ABNORMAL HIGH (ref 70–99)
Glucose-Capillary: 128 mg/dL — ABNORMAL HIGH (ref 70–99)
Glucose-Capillary: 170 mg/dL — ABNORMAL HIGH (ref 70–99)

## 2021-07-20 LAB — BASIC METABOLIC PANEL
Anion gap: 7 (ref 5–15)
BUN: 18 mg/dL (ref 8–23)
CO2: 28 mmol/L (ref 22–32)
Calcium: 8.4 mg/dL — ABNORMAL LOW (ref 8.9–10.3)
Chloride: 105 mmol/L (ref 98–111)
Creatinine, Ser: 1.03 mg/dL — ABNORMAL HIGH (ref 0.44–1.00)
GFR, Estimated: >60 mL/min (ref >60)
Glucose, Bld: 97 mg/dL (ref 70–99)
Potassium: 3.9 mmol/L (ref 3.5–5.1)
Sodium: 140 mmol/L (ref 135–145)

## 2021-07-20 LAB — CREATININE, SERUM
Creatinine, Ser: 0.92 mg/dL (ref 0.44–1.00)
GFR, Estimated: >60 mL/min (ref >60)

## 2021-07-20 MED ORDER — METHYLPREDNISOLONE SODIUM SUCC 125 MG IJ SOLR
80.0000 mg | Freq: Four times a day (QID) | INTRAMUSCULAR | Status: DC
  Administered 2021-07-20 – 2021-07-23 (×12): 80 mg via INTRAVENOUS
  Filled 2021-07-20 (×12): qty 2

## 2021-07-20 MED ORDER — BOOST / RESOURCE BREEZE PO LIQD CUSTOM
1.0000 | Freq: Three times a day (TID) | ORAL | Status: DC
  Administered 2021-07-20 – 2021-07-23 (×7): 1 via ORAL

## 2021-07-20 NOTE — Progress Notes (Signed)
PROGRESS NOTE  Kelli Acosta  DOB: [birth year]  PCP: System, Provider Not In [id number]  DOA: 07/12/2021  LOS: 8 days  Hospital Day: 9   Chief Complaint  Patient presents with   Shortness of Breath   Fever    Brief narrative: Kelli Acosta is a 65 y.o. 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  Patient is from out of town, currently visiting her daughter and follows up with oncologist Dr. Ralene Bathe. Patient presented to the ED on 07/12/2021 with complaint of progressively worsening dyspnea for several days. CT scan obtained on admission showed multifocal pneumonia and hence was admitted to hospital service. Her respiratory status was improving however 9/4, she started to get worse and is currently on 6 L oxygen by nasal cannula.     Subjective: Patient was seen and examined this afternoon.  Lying down in bed.  Continues to remain on 6 L oxygen.  He states he feels a little better today after she started incentive spirometry.  Assessment/Plan: Acute on chronic respiratory failure with hypoxia -Chronically on 2 L oxygen by nasal cannula in the setting of COPD and stage IV lung cancer -Acute respiratory decompensation probably from a combination of pneumonia, pulm edema and pleural effusion.  -Despite the use of IV antibiotics for the last 7 days, bilateral thoracentesis as well as significant diuresis with Lasix, patient continues to remain hypoxic and is currently requiring 6 L oxygen by nasal cannula. -Pulmonary consultation called. -Wean down as tolerated.  Sepsis secondary to multifocal pneumonia in the setting of immunocompromise status -on IV cefepime since admission -No fever.  WBC count improving.  Procalcitonin level 0.29.   -No growth in blood cultures so far.  Repeated chest x-rays done so far continue to show persistent pulm infiltrates  -Pulm consultation called. -Continue IV cefepime for now. 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 Ralene Bathe [phone]) at [place].  Last radiation was in February 2022. -Prior to admission, patient was on prednisone 10 g daily.  Continue the same. -Continue bronchodilators.  Anemia of chronic disease -Macrocytic.  No active bleeding. Recent Labs    07/15/21 0355 07/16/21 0400 07/17/21 0355 07/18/21 0315 07/19/21 0512  HGB 8.4* 7.6* 7.8* 7.6* 7.3*  MCV 100.4* 98.0 98.8 100.0 100.4*   Type 2 diabetes mellitus -A1c at 5.2 on 07/13/2021 -Currently on sliding scale insulin with Accu-Cheks. Recent Labs  Lab 07/19/21 1053 07/19/21 1719 07/19/21 2054 07/20/21 0727 07/20/21 1105  GLUCAP 122* 143* 180* 102* 158*  Hypokalemia -Improved with replacement. Recent Labs  Lab 07/16/21 0400 07/17/21 0355 07/18/21 0315 07/19/21 0512 07/20/21 0750  K 4.0 3.3* 3.5 3.3* 3.9   Chronic constipation -Continue bowel regimen   Depression -continue Zoloft, Lunesta  Mobility: PT eval obtained.  Home health PT recommended Code Status:   Code Status: Full Code  Nutritional status: Body mass index is 27.37 kg/m. Nutrition Problem: Inadequate oral intake Etiology: nausea, vomiting Signs/Symptoms: per patient/family report Diet:  Diet Order             Diet Carb Modified Fluid consistency: Thin; Room service appropriate? Yes  Diet effective now                  DVT prophylaxis:  enoxaparin (LOVENOX) injection 40 mg Start: 07/13/21 1300 SCDs Start: 07/12/21 1032   Antimicrobials: IV cefepime Fluid: None Consultants: Pulmonology Family Communication: Daughter Ms. Mardene Celeste not at bedside today  Status is: Inpatient  Remains inpatient appropriate because: Continues to require high flow oxygen even at  rest.  Dispo: The patient is from: Home              Anticipated d/c is to: Hopefully home with home health PT in 1 to 2 days              Patient currently is not medically stable to d/c.   Difficult to place patient No     Infusions:     Scheduled Meds:  Budeson-Glycopyrrol-Formoterol  2 puff Inhalation BID   calcium carbonate  1 tablet Oral TID WC   Chlorhexidine Gluconate Cloth  6 each Topical Daily   cholecalciferol  1,000 Units Oral Daily   dexmethylphenidate  5 mg Oral BID   enoxaparin (LOVENOX) injection  40 mg Subcutaneous Q24H   feeding supplement  1 Container Oral TID BM   folic acid  1 mg Oral BID   guaiFENesin  600 mg Oral BID   insulin aspart  0-6 Units Subcutaneous TID WC   ipratropium-albuterol  3 mL Nebulization TID   multivitamin with minerals  1 tablet Oral Daily   pantoprazole  40 mg Oral Daily   polyethylene glycol  17 g Oral Daily   psyllium  1 packet Oral BID   senna  1 tablet Oral Daily   sertraline  100 mg Oral Daily   Vitamin E  400 Units Oral Daily    Antimicrobials: Anti-infectives (From admission, onward)    Start     Dose/Rate Route Frequency Ordered Stop   07/14/21 1400  ceFEPIme (MAXIPIME) 2 g in sodium chloride 0.9 % 100 mL IVPB        2 g 200 mL/hr over 30 Minutes Intravenous Every 8 hours 07/14/21 1056 07/18/21 2310   07/14/21 1200  vancomycin (VANCOCIN) IVPB 1000 mg/200 mL premix  Status:  Discontinued        1,000 mg 200 mL/hr over 60 Minutes Intravenous Every 24 hours 07/13/21 1037 07/14/21 1049   07/13/21 1130  vancomycin (VANCOREADY) IVPB 1500 mg/300 mL        1,500 mg 150 mL/hr over 120 Minutes Intravenous  Once 07/13/21 1036 07/13/21 1421   07/12/21 1800  ceFEPIme (MAXIPIME) 2 g in sodium chloride 0.9 % 100 mL IVPB  Status:  Discontinued        2 g 200 mL/hr over 30 Minutes Intravenous Every 12 hours 07/12/21 1049 07/14/21 1056   07/12/21 1045  ceFEPIme (MAXIPIME) 2 g in sodium chloride 0.9 % 100 mL IVPB  Status:   Discontinued        2 g 200 mL/hr over 30 Minutes Intravenous  Once 07/12/21 1031 07/12/21 1033   07/12/21 0645  ceFEPIme (MAXIPIME) 2 g in sodium chloride 0.9 % 100 mL IVPB        2 g 200 mL/hr over 30 Minutes Intravenous  Once 07/12/21 0633 07/12/21 0737       PRN meds: acetaminophen **OR** acetaminophen, albuterol, ALPRAZolam, Glycerin (Adult), guaiFENesin, hydrOXYzine, lip balm, menthol-cetylpyridinium, ondansetron **OR** ondansetron (ZOFRAN) IV, oxyCODONE, prochlorperazine, sodium chloride flush, zolpidem   Objective: Vitals:   07/20/21 1240 07/20/21 1321  BP: 120/60   Pulse: 90   Resp: 18   Temp: 98.3 F (36.8 C)   SpO2: 96% 96%    Intake/Output Summary (Last 24 hours) at 07/20/2021 1518 Last data filed at 07/20/2021 1040 Gross per 24 hour  Intake 360 ml  Output 1600 ml  Net -1240 ml   Filed Weights   07/12/21 0651 07/18/21 1643  Weight: 71.2 kg 74.6 kg   Weight change:  Body mass index is 27.37 kg/m.   Physical Exam: General exam: Pleasant, middle-aged Caucasian female.  Looks weak.  Not in distress today Skin: No rashes, lesions or ulcers. HEENT: Atraumatic, normocephalic, no obvious bleeding Lungs: Mild end expiratory wheezing bilaterally.  Less cough on deep breathing today. CVS: Regular rate and rhythm, no murmur GI/Abd soft, nontender, nondistended, bowel sound present CNS: Alert, awake, oriented x3 Psychiatry: Depressed look Extremities: No pedal edema, no calf tenderness  Data Review: I have personally reviewed the laboratory data and studies available.  Recent Labs  Lab 07/15/21 0355 07/16/21 0400 07/17/21 0355 07/18/21 0315 07/19/21 0512  WBC 34.4* 22.0* 26.4* 22.9* 14.1*  NEUTROABS  --   --  24.8* 21.4* 12.9*  HGB 8.4* 7.6* 7.8* 7.6* 7.3*  HCT 28.0* 24.5* 25.0* 24.7* 24.0*  MCV 100.4* 98.0 98.8 100.0 100.4*  PLT 242 234 219 216 186   Recent Labs  Lab 07/16/21 0400 07/17/21 0355 07/18/21 0315 07/19/21 0512 07/20/21 0500  07/20/21 0750  NA 139 140 143 137  --  140  K 4.0 3.3* 3.5 3.3*  --  3.9  CL 111 107 108 102  --  105  CO2 26 26 29 29   --  28  GLUCOSE 109* 112* 103* 121*  --  97  BUN 22 21 21 23   --  18  CREATININE 0.98 0.94 0.92 0.93 0.92 1.03*  CALCIUM 8.2* 8.3* 8.3* 8.0*  --  8.4*    F/u labs ordered Unresulted Labs (From admission, onward)     Start     Ordered   07/21/21 0500  Procalcitonin  Daily,   R     Question:  Specimen collection method  Answer:  IV Team=IV Team collect   07/20/21 0751   07/21/21 0500  CBC with Differential/Platelet  Daily,   R     Question:  Specimen collection method  Answer:  IV Team=IV Team collect   07/20/21 0752   07/21/21 1610  Basic metabolic panel  Daily,   R     Question:  Specimen collection method  Answer:  IV Team=IV Team collect   07/20/21 0752   07/20/21 0500  Creatinine, serum  (enoxaparin (LOVENOX)    CrCl >/= 30 ml/min)  Weekly,   R     Comments: while on enoxaparin therapy   Question:  Specimen collection method  Answer:  IV Team=IV Team collect   07/13/21 1110            Signed, Terrilee Croak, MD [facility] 07/20/2021

## 2021-07-20 NOTE — Progress Notes (Signed)
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  Care is by [facility] system in [place].  Diagnosis of lung cancer for 2-1/2 yars ago.  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  This is according to history.  Review of the chart shows that she had a PET scan early November 2021 that shows multifocal axial and proximal appendicular osseous metastasis involving the calvarium, left fifth rib, spine mostly intense at the left L1, right proximal humerus and pelvis and bilateral ribs.  In December 2021 and February 2022 was diagnosed with multiple brain mets according to history.  She saw Dr. Franchot Heidelberg land at [place] and had CyberKnife treatment somewhere along this time her Beryle Flock was stopped according to the patient history on 07/20/2021 [facility] review shows she was on it along with prednisone as of April 15, 2021].  At this time she also had skeletal metastasis.  She was treated with gamma knife to the brain and also bilateral chest radiation lasting 6 weeks.  Then subsequently she said she had a lot of fatigue and tiredness and sleepiness.  She was diagnosed to have nocturnal hypoxemia and started on night oxygen sometime in May 2022.  Then in May-June 2022 she saw pulmonary Dr. Carl Best of the [facility] in [place], [place]..  She was started on 24/7 oxygen 2 L after that her fatigue and sleepiness improved.  Of note: Emphysema is noted in the chart and she is on Breztri but details of pulmonary function testing is not available.  Her most recent  chemotherapy was on 07/08/2021.  Then she was visiting her family in [place], [place] and then on 07/11/2021 she developed chills nausea vomiting and fever.  She was admitted on 07/12/2021.  Found to have SIRS physiology with tachypnea tachycardia leukocytosis and lactic acidosis mild of 2.8 that quickly resolved in 1 day.   Course in the hospital: She had a peak white count of 34.4 thousand on 07/14/2021 [admit white count was 18,005].  She has been afebrile all along except for slight low-grade fever on admit 07/12/2021.  Her white count is improved to 14,100.  In terms of her hypoxemia  - on 07/13/2021 she only required 2 L nasal cannula but she was found to be wheezing and her oral prednisone of 10 mg converted to IV steroids.  -  On 07/14/2021: Vancomycin was stopped.   = On 07/15/2021 oxygen was increased to 4 L nasal cannula after coughing spells.  Diffuse wheezing and accessory muscle use continued.  Was noticed that after enema her respiratory status actually improved.   - On 07/16/2021 oxygenation worsened to 6 L she was given IV Lasix.  - On 07/17/2021: Culture negative still hypoxemic at 6 L and still wheezing.  Status post 700 mL right-sided thoracentesis on the right side..  Continue Lasix -On 07/18/2021: Slight improvement in hypoxemia to 4 L -07/19/2021: Left-sided thoracentesis for 100 cc -19/10/22: Still requiring 6 L wheezing and pulmonary consulted.  Cultures negative so far.  Continues to be afebrile.  White count improved to 14,000  Her current antibiotics include Cefepime 07/12/2021 - 07/14/2021 Vancomycin 07/13/2021 - 07/14/2021 Cefepime 07/14/2021-[07/18/2021]     Pertinent  Medical History   See above  Allergies  Allergen Reactions   Codeine Hives and Itching   Promethazine Other (See Comments)    jittery   Sulfa Antibiotics      There is no immunization history on file for this patient.  No family history on file.   Current Facility-Administered Medications:     acetaminophen (TYLENOL) tablet 650 mg, 650 mg, Oral, Q6H PRN, 650 mg at 07/20/21 0957 **OR** acetaminophen (TYLENOL) suppository 650 mg, 650 mg, Rectal, Q6H PRN, Marylyn Ishihara, Tyrone A, DO   albuterol (PROVENTIL) (2.5 MG/3ML) 0.083% nebulizer solution 3 mL, 3 mL, Nebulization, Q4H PRN, Dahal, Binaya, MD, 3 mL at 07/18/21 0630   ALPRAZolam (XANAX) tablet 1 mg, 1 mg, Oral, QID PRN, Dahal, Marlowe Aschoff, MD, 1 mg at 07/20/21 1309   Budeson-Glycopyrrol-Formoterol 160-9-4.8 MCG/ACT AERO 2 puff, 2 puff, Inhalation, BID, Kc, Ramesh, MD, 2 puff at 07/20/21 0749   calcium carbonate (TUMS - dosed in mg elemental calcium) chewable tablet 200 mg of elemental calcium, 1 tablet, Oral, TID WC, Kyle, Tyrone A, DO, 200 mg of elemental calcium at 07/20/21 1307   Chlorhexidine Gluconate Cloth 2 % PADS 6 each, 6 each, Topical, Daily, Kyle, Tyrone A, DO, 6 each at 07/20/21 1041   cholecalciferol (VITAMIN D3) tablet 1,000 Units, 1,000 Units, Oral, Daily, Kc, Ramesh, MD, 1,000 Units at 07/20/21 5102   dexmethylphenidate (FOCALIN) tablet 5 mg, 5 mg, Oral, BID, Kc, Ramesh, MD, 5 mg at 07/20/21 1307   enoxaparin (LOVENOX) injection 40 mg, 40 mg, Subcutaneous, Q24H, Kc, Ramesh, MD, 40 mg at 07/20/21 1307   feeding supplement (BOOST / RESOURCE BREEZE) liquid 1 Container, 1 Container, Oral, TID BM, Dahal, Marlowe Aschoff, MD, 1 Container at 58/52/77 8242   folic acid (FOLVITE) tablet 1 mg, 1 mg, Oral, BID, Kc, Ramesh, MD, 1 mg at 07/20/21 3536   Glycerin (Adult) 2 g suppository 1 suppository, 1 suppository, Rectal, Daily PRN, Richarda Osmond, MD, 1 suppository at 07/14/21 2304   guaiFENesin (MUCINEX) 12 hr tablet 600 mg, 600 mg, Oral, BID, Kyle, Tyrone A, DO, 600 mg at 07/20/21 1443   guaiFENesin tablet 200 mg, 200 mg, Oral, Q4H PRN, Kc, Ramesh, MD, 200 mg at 07/16/21 1540   hydrOXYzine (ATARAX/VISTARIL) tablet 25-50 mg, 25-50 mg, Oral, TID PRN, Kc, Ramesh, MD   insulin aspart (novoLOG) injection 0-6 Units, 0-6 Units, Subcutaneous, TID WC, Kyle,  Tyrone A, DO, 1 Units at 07/20/21 1126   ipratropium-albuterol (DUONEB) 0.5-2.5 (3) MG/3ML nebulizer solution 3 mL, 3 mL, Nebulization, TID, Dahal, Binaya, MD, 3 mL at 07/20/21 1320   lip balm (CARMEX) ointment, , Topical, PRN, Richarda Osmond, MD   menthol-cetylpyridinium (CEPACOL) lozenge 3 mg, 1 lozenge, Oral, PRN, Dahal, Marlowe Aschoff, MD, 3 mg at 07/18/21 1841   multivitamin with minerals tablet 1 tablet, 1 tablet, Oral, Daily, Kc, Ramesh, MD, 1 tablet at 07/20/21 0952   ondansetron (ZOFRAN) tablet 4 mg, 4 mg, Oral, Q6H PRN **OR** ondansetron (ZOFRAN) injection 4 mg, 4 mg, Intravenous, Q6H PRN, Kyle, Tyrone A, DO, 4 mg at 07/19/21 1132   oxyCODONE (Oxy IR/ROXICODONE) immediate release tablet 10 mg, 10 mg, Oral, Q6H PRN, Kc, Ramesh, MD, 10 mg at 07/20/21 1307   pantoprazole (PROTONIX) EC tablet 40 mg, 40 mg, Oral, Daily, Kyle, Tyrone A, DO, 40 mg at 07/20/21 0952   polyethylene glycol (MIRALAX / GLYCOLAX) packet 17 g, 17 g,  Oral, Daily, Richarda Osmond, MD, 17 g at 07/20/21 6387   prochlorperazine (COMPAZINE) tablet 10 mg, 10 mg, Oral, Q6H PRN, Antonieta Pert, MD   psyllium (HYDROCIL/METAMUCIL) 1 packet, 1 packet, Oral, BID, Richarda Osmond, MD, 1 packet at 07/20/21 1116   senna (SENOKOT) tablet 8.6 mg, 1 tablet, Oral, Daily, Doristine Mango L, MD, 8.6 mg at 07/20/21 5643   sertraline (ZOLOFT) tablet 100 mg, 100 mg, Oral, Daily, Kc, Ramesh, MD, 100 mg at 07/20/21 3295   sodium chloride flush (NS) 0.9 % injection 10-40 mL, 10-40 mL, Intracatheter, PRN, Marylyn Ishihara, Tyrone A, DO, 10 mL at 07/19/21 1884   Vitamin E CAPS 400 Units, 400 Units, Oral, Daily, Kc, Ramesh, MD, 400 Units at 07/20/21 1115   zolpidem (AMBIEN) tablet 5 mg, 5 mg, Oral, QHS PRN, Kc, Ramesh, MD, 5 mg at 07/19/21 [place] Hospital Events: Including procedures, antibiotic start and stop dates in addition to other pertinent events     Interim History / Subjective:  x  Objective   Blood pressure 120/60, pulse 90,  temperature 98.3 F (36.8 C), temperature source Oral, resp. rate 18, height 5\' 5"  (1.651 m), weight 74.6 kg, SpO2 96 %.        Intake/Output Summary (Last 24 hours) at 07/20/2021 1512 Last data filed at 07/20/2021 1040 Gross per 24 hour  Intake 360 ml  Output 1600 ml  Net -1240 ml   Filed Weights   07/12/21 0651 07/18/21 1643  Weight: 71.2 kg 74.6 kg    Examination: General: Extremely frail cachectic lady with lean body mass index.  Sitting in the bed watching TV on the computer HENT: No elevated JVP or neck nodes.  Has nasal cannula 6 L on Lungs: Mild tachypnea with diffuse extensive expiratory wheezing bilaterally equal Cardiovascular: Tachycardic normal heart sounds no murmurs Abdomen: Soft nontender Extremities: Diffuse muscular wasting  Neuro: Alert and oriented x3.  Speech normal.  Alopecia present GU: Not examined  Resolved Hospital Problem list   x  Assessment & Plan:   History of emphysema: Prior to & Present on Admit   History of non-small cell lung cancer with diffuse skeletal metastasis and bilateral chest radiation -in spring 2022 -> following which she went on oxygen  History of radiation pneumonitis: Based on above- Prior to & Present on Admit  Chronic hypoxic respiratory failure: Prior to & Present on Admit  Acute on chronic hypoxic respiratory failure -presenting problem at admission due to infiltrates  07/11/2021: She is progressively gotten worse since admission despite treatment with antibiotics for culture-negative, urine strep negative, urine Legionella negative for high probably pulmonary infectious process.  Differential diagnosis includes checkpoint intermittent pneumonitis [last documentation of Beryle Flock was in May 2022] current chemo regimen not known.  Unclear if she is on other pneumotox medication such as nitrofurantoin  Plan -  -Check respiratory virus panel for other viruses -IV steroids 80 mg every 6 hours [given culture-negative UTI and  being on antibiotics for several days probably safe to do so]. -Noted home dose of prednisone 10 mg daily -Pharmacy consult for understanding her home chemo and checkpoint inhibitor lung cancer regimen - If pleural effusions recur then will need cytology -Overall prognosis poor: Consider transfer back to [facility] all established goals of care -Continue bronchodilator regimen  Other issues  - Severe protein calorie malnutrition: Prior to & Present on Admit -Failure to thrive: Prior to & Present on Admit - anemia: Prior to & Present on [place] -  According to the hospitalist  Best Practice (right click and "Reselect all SmartList Selections" daily)   According to the hospitalist  SIGNATURE    Dr. Brand Males, M.D., F.C.C.P,  Pulmonary and Critical Care Medicine Staff Physician, [place] Director - Interstitial Lung Disease  Program  Pulmonary [facility] [place], [place], [address]  NPI Number:  NPI #[id number]  Pager: [phone], If no answer  -> Check AMION or Try [phone] Telephone (clinical office): [phone] Telephone (research): [phone]  3:12 PM 07/20/2021    LABS    PULMONARY No results for input(s): PHART, PCO2ART, PO2ART, HCO3, TCO2, O2SAT in the last 168 hours.  Invalid input(s): PCO2, PO2  CBC Recent Labs  Lab 07/17/21 0355 07/18/21 0315 07/19/21 0512  HGB 7.8* 7.6* 7.3*  HCT 25.0* 24.7* 24.0*  WBC 26.4* 22.9* 14.1*  PLT 219 216 186    COAGULATION No results for input(s): INR in the last 168 hours.  CARDIAC  No results for input(s): TROPONINI in the last 168 hours. No results for input(s): PROBNP in the last 168 hours.   CHEMISTRY Recent Labs  Lab 07/16/21 0400 07/17/21 0355 07/18/21 0315 07/19/21 0512 07/20/21 0500 07/20/21 0750  NA 139 140 143 137  --  140  K 4.0 3.3* 3.5 3.3*  --  3.9  CL 111 107 108 102  --  105  CO2 26 26 29 29   --  28   GLUCOSE 109* 112* 103* 121*  --  97  BUN 22 21 21 23   --  18  CREATININE 0.98 0.94 0.92 0.93 0.92 1.03*  CALCIUM 8.2* 8.3* 8.3* 8.0*  --  8.4*   Estimated Creatinine Clearance: 55 mL/min (A) (by C-G formula based on SCr of 1.03 mg/dL (H)).   LIVER No results for input(s): AST, ALT, ALKPHOS, BILITOT, PROT, ALBUMIN, INR in the last 168 hours.   INFECTIOUS Recent Labs  Lab 07/20/21 0750  PROCALCITON 0.29     ENDOCRINE CBG (last 3)  Recent Labs    07/19/21 2054 07/20/21 0727 07/20/21 1105  GLUCAP 180* 102* 158*         IMAGING x48h  - image(s) personally visualized  -   highlighted in bold DG Chest 1 View  Result Date: 07/19/2021 CLINICAL DATA:  Post thoracentesis EXAM: CHEST  1 VIEW COMPARISON:  Exam at 1653 hours compared to 07/17/2021 FINDINGS: LEFT jugular Port-A-Cath with tip projecting over RIGHT atrium. Normal heart size. Extensive BILATERAL pulmonary infiltrates. No definite pleural effusion or pneumothorax. Chronic osseous expansion of the posterior LEFT fifth rib, patient with known osseous metastatic disease. IMPRESSION: Persistent pulmonary infiltrates. No pneumothorax. Electronically Signed   By: Lavonia Dana M.D.   On: 07/19/2021 19:14   US THORACENTESIS ASP PLEURAL SPACE W/IMG GUIDE  Result Date: 07/19/2021 INDICATION: Patient is metastatic lung cancer with left pleural effusion. Request therapeutic thoracentesis. EXAM: ULTRASOUND GUIDED LEFT THORACENTESIS MEDICATIONS: 1% plain lidocaine, 1 mL COMPLICATIONS: None immediate. PROCEDURE: An ultrasound guided thoracentesis was thoroughly discussed with the patient and questions answered. The benefits, risks, alternatives and complications were also discussed. The patient understands and wishes to proceed with the procedure. Written consent was obtained. Ultrasound was performed to localize and mark an adequate pocket of fluid in the left chest. The area was then prepped and draped in the normal sterile fashion. 1%  Lidocaine was used for local anesthesia. Under ultrasound guidance a 6 Fr Safe-T-Centesis catheter was  introduced. Thoracentesis was performed. The catheter was removed and a dressing applied. FINDINGS: A total of approximately 400 mL of clear yellow fluid was removed. IMPRESSION: Successful ultrasound guided left thoracentesis yielding 400 mL of pleural fluid. Read by Ascencion Dike PA-C Electronically Signed   By: Aletta Edouard M.D.   On: 07/19/2021 16:54

## 2021-07-21 ENCOUNTER — Inpatient Hospital Stay (HOSPITAL_COMMUNITY): Payer: Medicare PPO

## 2021-07-21 DIAGNOSIS — J9601 Acute respiratory failure with hypoxia: Secondary | ICD-10-CM | POA: Diagnosis not present

## 2021-07-21 LAB — CBC WITH DIFFERENTIAL/PLATELET
Abs Immature Granulocytes: 0.33 10*3/uL — ABNORMAL HIGH (ref 0.00–0.07)
Basophils Absolute: 0 10*3/uL (ref 0.0–0.1)
Basophils Relative: 0 %
Eosinophils Absolute: 0 10*3/uL (ref 0.0–0.5)
Eosinophils Relative: 0 %
HCT: 25.7 % — ABNORMAL LOW (ref 36.0–46.0)
Hemoglobin: 7.9 g/dL — ABNORMAL LOW (ref 12.0–15.0)
Immature Granulocytes: 2 %
Lymphocytes Relative: 2 %
Lymphs Abs: 0.3 10*3/uL — ABNORMAL LOW (ref 0.7–4.0)
MCH: 30.9 pg (ref 26.0–34.0)
MCHC: 30.7 g/dL (ref 30.0–36.0)
MCV: 100.4 fL — ABNORMAL HIGH (ref 80.0–100.0)
Monocytes Absolute: 0.2 10*3/uL (ref 0.1–1.0)
Monocytes Relative: 1 %
Neutro Abs: 21.4 10*3/uL — ABNORMAL HIGH (ref 1.7–7.7)
Neutrophils Relative %: 95 %
Platelets: 196 10*3/uL (ref 150–400)
RBC: 2.56 MIL/uL — ABNORMAL LOW (ref 3.87–5.11)
RDW: 16.7 % — ABNORMAL HIGH (ref 11.5–15.5)
WBC: 22.3 10*3/uL — ABNORMAL HIGH (ref 4.0–10.5)
nRBC: 0 % (ref 0.0–0.2)

## 2021-07-21 LAB — BASIC METABOLIC PANEL
Anion gap: 10 (ref 5–15)
BUN: 21 mg/dL (ref 8–23)
CO2: 27 mmol/L (ref 22–32)
Calcium: 8.6 mg/dL — ABNORMAL LOW (ref 8.9–10.3)
Chloride: 100 mmol/L (ref 98–111)
Creatinine, Ser: 1.04 mg/dL — ABNORMAL HIGH (ref 0.44–1.00)
GFR, Estimated: 60 mL/min — ABNORMAL LOW (ref >60)
Glucose, Bld: 199 mg/dL — ABNORMAL HIGH (ref 70–99)
Potassium: 4.6 mmol/L (ref 3.5–5.1)
Sodium: 137 mmol/L (ref 135–145)

## 2021-07-21 LAB — GLUCOSE, CAPILLARY
Glucose-Capillary: 145 mg/dL — ABNORMAL HIGH (ref 70–99)
Glucose-Capillary: 218 mg/dL — ABNORMAL HIGH (ref 70–99)
Glucose-Capillary: 273 mg/dL — ABNORMAL HIGH (ref 70–99)
Glucose-Capillary: 199 mg/dL — ABNORMAL HIGH (ref 70–99)

## 2021-07-21 LAB — PROCALCITONIN: Procalcitonin: 0.2 ng/mL

## 2021-07-21 MED ORDER — FUROSEMIDE 10 MG/ML IJ SOLN
40.0000 mg | Freq: Once | INTRAMUSCULAR | Status: AC
  Administered 2021-07-21: 40 mg via INTRAVENOUS
  Filled 2021-07-21: qty 4

## 2021-07-21 NOTE — Progress Notes (Signed)
PROGRESS NOTE  Kelli Acosta  DOB: June 18, 1956  PCP: System, Provider Not In CNO:[id number]  DOA: 07/12/2021  LOS: 9 days  Hospital Day: 10   Chief Complaint  Patient presents with   Shortness of Breath   Fever    Brief narrative: Kelli Acosta is a 65 y.o. 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  Patient is from out of town, currently visiting her daughter and follows up with oncologist Dr. Ralene Bathe. Patient presented to the ED on 07/12/2021 with complaint of progressively worsening dyspnea for several days. CT scan obtained on admission showed multifocal pneumonia and hence was admitted to hospital service. Her respiratory status was improving however 9/4, she started to get worse and is currently on 6 L oxygen by nasal cannula.     Subjective: Patient was seen and examined this morning.  Propped up in bed.  On 5 L oxygen.  States she feels a little better today.  On minimal exertion, starts coughing violently and oxygen saturation drops.. Pulmonary consult appreciated  Assessment/Plan: Acute on chronic respiratory failure with hypoxia -Chronically on 2 L oxygen by nasal cannula in the setting of COPD and stage IV lung cancer -Acute respiratory decompensation probably from a combination of pneumonia, pulm edema and pleural effusion.  -Despite the use of IV antibiotics for more than a week, bilateral thoracentesis as well as significant diuresis with Lasix, patient continued to remain hypoxic and is currently requiring 6 L oxygen by nasal cannula.  On 9/10, pulmonary consultation was called.  Additional suspicion of interstitial pneumonitis related to chemotherapy agents.  Currently on IV Solu-Medrol 80 mg every 6 hours. -Blood pressure stable, creatinine stable.  I would repeat a dose of Lasix 40 mg this morning. -Wean down oxygen as tolerated.  Sepsis secondary to multifocal pneumonia in the setting of  immunocompromise status -Completed a 1 week course of IV cefazolin. -Sepsis physiology improved. No fever.  WBC count improving.  Procalcitonin level improving. -No growth in blood cultures so far.  Repeated chest x-rays done so far continue to show persistent pulm infiltrates  Recent Labs  Lab 07/16/21 0400 07/17/21 0355 07/18/21 0315 07/19/21 0512 07/20/21 0750 07/21/21 0407  WBC 22.0* 26.4* 22.9* 14.1*  --  22.3*  PROCALCITON  --   --   --   --  0.29 0.20    Stage IV lung cancer with mets to brain COPD -Patient follows up with oncologist Dr. Ralene Bathe [phone]) at her hometown.  On prednisone 10 mg daily at home.  Not on chemotherapy recently.  Last radiation was in February 2022. -Currently on IV Solu-Medrol. -Continue bronchodilators.  Anemia of chronic disease -Macrocytic.  No active bleeding. Recent Labs    07/16/21 0400 07/17/21 0355 07/18/21 0315 07/19/21 0512 07/21/21 0407  HGB 7.6* 7.8* 7.6* 7.3* 7.9*  MCV 98.0 98.8 100.0 100.4* 100.4*    Type 2 diabetes mellitus -A1c at 5.2 on 07/13/2021 -Currently on sliding scale insulin with Accu-Cheks.  Blood sugar level has been consistently less than 200.  Anticipate rise with the use of high-dose steroids.  May need long-acting insulin.  Continue to monitor. Recent Labs  Lab 07/20/21 0727 07/20/21 1105 07/20/21 1637 07/20/21 1931 07/21/21 0717  GLUCAP 102* 158* 128* 170* 145*   Hypokalemia -Improved with replacement. Recent Labs  Lab 07/17/21 0355 07/18/21 0315 07/19/21 0512 07/20/21 0750 07/21/21 0407  K 3.3* 3.5 3.3* 3.9 4.6    Chronic  constipation -Continue bowel regimen   Depression -continue Zoloft, Lunesta  Mobility: PT eval obtained.  Home health PT recommended Code Status:   Code Status: Full Code  Nutritional status: Body mass index is 27.37 kg/m. Nutrition Problem: Inadequate oral intake Etiology: nausea, vomiting Signs/Symptoms: per patient/family report Diet:  Diet Order              Diet Carb Modified Fluid consistency: Thin; Room service appropriate? Yes  Diet effective now                  DVT prophylaxis:  enoxaparin (LOVENOX) injection 40 mg Start: 07/13/21 1300 SCDs Start: 07/12/21 1032   Antimicrobials: Completed the course Fluid: None Consultants: Pulmonology Family Communication: Daughter Ms. Mardene Celeste at bedside today  Status is: Inpatient  Remains inpatient appropriate because: Continues to require high flow oxygen even at rest.  Dispo: The patient is from: Home              Anticipated d/c is to: Hopefully home with home health PT in 1 to 2 days              Patient currently is not medically stable to d/c.   Difficult to place patient No     Infusions:     Scheduled Meds:  Budeson-Glycopyrrol-Formoterol  2 puff Inhalation BID   calcium carbonate  1 tablet Oral TID WC   Chlorhexidine Gluconate Cloth  6 each Topical Daily   cholecalciferol  1,000 Units Oral Daily   dexmethylphenidate  5 mg Oral BID   enoxaparin (LOVENOX) injection  40 mg Subcutaneous Q24H   feeding supplement  1 Container Oral TID BM   folic acid  1 mg Oral BID   furosemide  40 mg Intravenous Once   guaiFENesin  600 mg Oral BID   insulin aspart  0-6 Units Subcutaneous TID WC   ipratropium-albuterol  3 mL Nebulization TID   methylPREDNISolone (SOLU-MEDROL) injection  80 mg Intravenous Q6H   multivitamin with minerals  1 tablet Oral Daily   pantoprazole  40 mg Oral Daily   polyethylene glycol  17 g Oral Daily   psyllium  1 packet Oral BID   senna  1 tablet Oral Daily   sertraline  100 mg Oral Daily   Vitamin E  400 Units Oral Daily    Antimicrobials: Anti-infectives (From admission, onward)    Start     Dose/Rate Route Frequency Ordered Stop   07/14/21 1400  ceFEPIme (MAXIPIME) 2 g in sodium chloride 0.9 % 100 mL IVPB        2 g 200 mL/hr over 30 Minutes Intravenous Every 8 hours 07/14/21 1056 07/18/21 2310   07/14/21 1200  vancomycin (VANCOCIN) IVPB  1000 mg/200 mL premix  Status:  Discontinued        1,000 mg 200 mL/hr over 60 Minutes Intravenous Every 24 hours 07/13/21 1037 07/14/21 1049   07/13/21 1130  vancomycin (VANCOREADY) IVPB 1500 mg/300 mL        1,500 mg 150 mL/hr over 120 Minutes Intravenous  Once 07/13/21 1036 07/13/21 1421   07/12/21 1800  ceFEPIme (MAXIPIME) 2 g in sodium chloride 0.9 % 100 mL IVPB  Status:  Discontinued        2 g 200 mL/hr over 30 Minutes Intravenous Every 12 hours 07/12/21 1049 07/14/21 1056   07/12/21 1045  ceFEPIme (MAXIPIME) 2 g in sodium chloride 0.9 % 100 mL IVPB  Status:  Discontinued  2 g 200 mL/hr over 30 Minutes Intravenous  Once 07/12/21 1031 07/12/21 1033   07/12/21 0645  ceFEPIme (MAXIPIME) 2 g in sodium chloride 0.9 % 100 mL IVPB        2 g 200 mL/hr over 30 Minutes Intravenous  Once 07/12/21 0633 07/12/21 0737       PRN meds: acetaminophen **OR** acetaminophen, albuterol, ALPRAZolam, Glycerin (Adult), guaiFENesin, hydrOXYzine, lip balm, menthol-cetylpyridinium, ondansetron **OR** ondansetron (ZOFRAN) IV, oxyCODONE, prochlorperazine, sodium chloride flush, zolpidem   Objective: Vitals:   07/20/21 2001 07/21/21 0834  BP:    Pulse:    Resp:    Temp:    SpO2: 97% 96%    Intake/Output Summary (Last 24 hours) at 07/21/2021 1024 Last data filed at 07/21/2021 0655 Gross per 24 hour  Intake 360 ml  Output 1750 ml  Net -1390 ml    Filed Weights   07/12/21 0651 07/18/21 1643  Weight: 71.2 kg 74.6 kg   Weight change:  Body mass index is 27.37 kg/m.   Physical Exam: General exam: Pleasant, middle-aged Caucasian female.  Looks weak.  Not in distress today Skin: No rashes, lesions or ulcers. HEENT: Atraumatic, normocephalic, no obvious bleeding Lungs: Mild end expiratory wheezing bilaterally.  Continues to cough and deep breathing CVS: Regular rate and rhythm, no murmur GI/Abd soft, nontender, nondistended, bowel sound present CNS: Alert, awake, oriented x3 Psychiatry:  Depressed look Extremities: No pedal edema, no calf tenderness  Data Review: I have personally reviewed the laboratory data and studies available.  Recent Labs  Lab 07/16/21 0400 07/17/21 0355 07/18/21 0315 07/19/21 0512 07/21/21 0407  WBC 22.0* 26.4* 22.9* 14.1* 22.3*  NEUTROABS  --  24.8* 21.4* 12.9* 21.4*  HGB 7.6* 7.8* 7.6* 7.3* 7.9*  HCT 24.5* 25.0* 24.7* 24.0* 25.7*  MCV 98.0 98.8 100.0 100.4* 100.4*  PLT 234 219 216 186 196    Recent Labs  Lab 07/17/21 0355 07/18/21 0315 07/19/21 0512 07/20/21 0500 07/20/21 0750 07/21/21 0407  NA 140 143 137  --  140 137  K 3.3* 3.5 3.3*  --  3.9 4.6  CL 107 108 102  --  105 100  CO2 26 29 29   --  28 27  GLUCOSE 112* 103* 121*  --  97 199*  BUN 21 21 23   --  18 21  CREATININE 0.94 0.92 0.93 0.92 1.03* 1.04*  CALCIUM 8.3* 8.3* 8.0*  --  8.4* 8.6*     F/u labs ordered Unresulted Labs (From admission, onward)     Start     Ordered   07/21/21 0500  Procalcitonin  Daily,   R     Question:  Specimen collection method  Answer:  IV Team=IV Team collect   07/20/21 0751   07/21/21 0500  CBC with Differential/Platelet  Daily,   R     Question:  Specimen collection method  Answer:  IV Team=IV Team collect   07/20/21 0752   07/21/21 9357  Basic metabolic panel  Daily,   R     Question:  Specimen collection method  Answer:  IV Team=IV Team collect   07/20/21 0752   07/20/21 0500  Creatinine, serum  (enoxaparin (LOVENOX)    CrCl >/= 30 ml/min)  Weekly,   R     Comments: while on enoxaparin therapy   Question:  Specimen collection method  Answer:  IV Team=IV Team collect   07/13/21 1110            Signed, Terrilee Croak, MD [facility] 07/21/2021

## 2021-07-21 NOTE — Progress Notes (Signed)
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  Care is by [facility] system in [place].  Diagnosis of lung cancer for 2-1/2 yars ago.  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  This is according to history.  Review of the chart shows that she had a PET scan early November 2021 that shows multifocal axial and proximal appendicular osseous metastasis involving the calvarium, left fifth rib, spine mostly intense at the left L1, right proximal humerus and pelvis and bilateral ribs.  In December 2021 and February 2022 was diagnosed with multiple brain mets according to history.  She saw Dr. Franchot Heidelberg land at [place] and had CyberKnife treatment somewhere along this time her Beryle Flock was stopped according to the patient history on 07/20/2021 [facility] [employer] review shows she was on it along with prednisone as of April 15, 2021].  At this time she also had skeletal metastasis.  She was treated with gamma knife to the brain and also bilateral chest radiation lasting 6 weeks.  Then subsequently she said she had a lot of fatigue and tiredness and sleepiness.  She was diagnosed to have nocturnal hypoxemia and started on night oxygen sometime in May 2022.  Then in May-June 2022 she saw pulmonary Dr. Carl Best of the [facility] in [place], [place]..  She was started on 24/7 oxygen 2 L after that her fatigue and sleepiness improved.  Of note: Emphysema is noted in the chart and she is on Breztri but details of pulmonary function testing is not available.  Her most recent  chemotherapy was on 07/08/2021.  Then she was visiting her family in [place], [place] and then on 07/11/2021 she developed chills nausea vomiting and fever.  She was admitted on 07/12/2021.  Found to have SIRS physiology with tachypnea tachycardia leukocytosis and lactic acidosis mild of 2.8 that quickly resolved in 1 day.   Course in the hospital: She had a peak white count of 34.4 thousand on 07/14/2021 [admit white count was 18,005].  She has been afebrile all along except for slight low-grade fever on admit 07/12/2021.  Her white count is improved to 14,100.  In terms of her hypoxemia  - on 07/13/2021 she only required 2 L nasal cannula but she was found to be wheezing and her oral prednisone of 10 mg converted to IV steroids.  -  On 07/14/2021: Vancomycin was stopped.   = On 07/15/2021 oxygen was increased to 4 L nasal cannula after coughing spells.  Diffuse wheezing and accessory muscle use continued.  Was noticed that after enema her respiratory status actually improved.   - On 07/16/2021 oxygenation worsened to 6 L she was given IV Lasix.  - On 07/17/2021: Culture negative still hypoxemic at 6 L and still wheezing.  Status post 700 mL right-sided thoracentesis on the right side..  Continue Lasix -On 07/18/2021: Slight improvement in hypoxemia to 4 L -07/19/2021: Left-sided thoracentesis for 100 cc -19/10/22: Still requiring 6 L wheezing and pulmonary consulted.  Cultures negative so far.  Continues to be afebrile.  White count improved to 14,000  Her current antibiotics include Cefepime 07/12/2021 - 07/14/2021 Vancomycin 07/13/2021 - 07/14/2021 Cefepime 07/14/2021-[07/18/2021]     Pertinent  Medical History   Significant Hospital Events: Including procedures, antibiotic start and stop dates in addition to other pertinent events   Admit - 07/12/2021 07/20/21 - ccm consult. sTart IV steroids  Interim History / Subjective:   9/11 - downt o 4L [place]. Feeling better. No wheeze. Prefers to finish hospitliation  in [place] and get discharged to [facility] [place]. Does not want transfer to [facility]  Objective   Blood pressure 129/64, pulse (!) 101, temperature 98.6 F (37 C), temperature source Oral, resp. rate 20, height 5\' 5"  (1.651 m), weight 74.6 kg, SpO2 96 %.        Intake/Output Summary (Last 24 hours) at 07/21/2021 1618 Last data filed at 07/21/2021 1401 Gross per 24 hour  Intake 200 ml  Output 2750 ml  Net -2550 ml   Filed Weights   07/12/21 0651 07/18/21 1643  Weight: 71.2 kg 74.6 kg    Examination: General: Extremely frail cachectic lady with lean body mass index.  Sitting in the bed watching TV on the computer HENT: No elevated JVP or neck nodes.  Has nasal cannula 4 L on Lungs: tachypnea and wheeze resolved Cardiovascular: Tachycardic normal heart sounds no murmurs Abdomen: Soft nontender Extremities: Diffuse muscular wasting  Neuro: Alert and oriented x3.  Speech normal.  Alopecia present GU: Not examined  Resolved Hospital Problem list   x  Assessment & Plan:   History of emphysema: Prior to & Present on Admit   History of non-small cell lung cancer with diffuse skeletal metastasis and bilateral chest radiation -in spring 2022 -> following which she went on oxygen/ Rx with [place] as of May 2022   History of radiation pneumonitis: Based on above- Prior to & Present on Admit  Chronic hypoxic respiratory failure 2---3L: Prior to & Present on Admit  Acute on chronic hypoxic respiratory failure -presenting problem at admission due to infiltrates  07/20/2021: She is progressively gotten worse since admission despite treatment with antibiotics for culture-negative, urine strep negative, urine Legionella negative for high probably pulmonary infectious process.  Differential diagnosis includes checkpoint intermittent pneumonitis [last documentation of Beryle Flock was in May 2022] current chemo regimen not known.  Unclear if she is on other pneumotox medication such as  nitrofurantoin   07/21/21 - improved after IV Solumedrol to 4L [place] and no wheezing. RVP neg  Plan -  -Continue D2 IV steroids 80 mg every 6 hours [given culture-negative UTI and being on antibiotics for several days probably safe to do so]. -Noted home dose of prednisone 10 mg daily -Pharmacy consult for understanding her home chemo and checkpoint inhibitor lung cancer regimen - d/w Graylin Shiver - If pleural effusions recur then will need cytology -Overall prognosis poor: Consider LTAC v home with PT/[facility] (does not want to go back to [facility]) -Continue bronchodilator regimen  Other issues  - Severe protein calorie malnutrition: Prior to & Present on Admit -Failure to thrive: Prior to & Present on Admit - anemia: Prior to & Present on Admit - Physical deconditoning: Prior to & Present on Admit   Plan - According to the hospitalist  Best Practice (right click and "Reselect all SmartList Selections" daily)   According to the hospitalist  SIGNATURE    Dr. Brand Males, M.D., F.C.C.P,  Pulmonary and Critical Care Medicine Staff Physician, [place] Director - Interstitial Lung Disease  Program  Pulmonary  [facility] [place], [place], [address]  NPI Number:  NPI #[id number]  Pager: [phone], If no answer  -> Check AMION or Try [phone] Telephone (clinical office): [phone] Telephone (research): [phone]  4:18 PM 07/21/2021    LABS    PULMONARY No results for input(s): PHART, PCO2ART, PO2ART, HCO3, TCO2, O2SAT in the last 168 hours.  Invalid input(s): PCO2, PO2  CBC Recent Labs  Lab 07/18/21 0315 07/19/21 0512 07/21/21 0407  HGB 7.6* 7.3* 7.9*  HCT 24.7* 24.0* 25.7*  WBC 22.9* 14.1* 22.3*  PLT 216 186 196    COAGULATION No results for input(s): INR in the last 168 hours.  CARDIAC  No results for input(s): TROPONINI in the last 168 hours. No results for  input(s): PROBNP in the last 168 hours.   CHEMISTRY Recent Labs  Lab 07/17/21 0355 07/18/21 0315 07/19/21 0512 07/20/21 0500 07/20/21 0750 07/21/21 0407  NA 140 143 137  --  140 137  K 3.3* 3.5 3.3*  --  3.9 4.6  CL 107 108 102  --  105 100  CO2 26 29 29   --  28 27  GLUCOSE 112* 103* 121*  --  97 199*  BUN 21 21 23   --  18 21  CREATININE 0.94 0.92 0.93 0.92 1.03* 1.04*  CALCIUM 8.3* 8.3* 8.0*  --  8.4* 8.6*   Estimated Creatinine Clearance: 54.5 mL/min (A) (by C-G formula based on SCr of 1.04 mg/dL (H)).   LIVER No results for input(s): AST, ALT, ALKPHOS, BILITOT, PROT, ALBUMIN, INR in the last 168 hours.   INFECTIOUS Recent Labs  Lab 07/20/21 0750 07/21/21 0407  PROCALCITON 0.29 0.20     ENDOCRINE CBG (last 3)  Recent Labs    07/20/21 1931 07/21/21 0717 07/21/21 1114  GLUCAP 170* 145* 218*         IMAGING x48h  - image(s) personally visualized  -   highlighted in bold DG Chest 1 View  Result Date: 07/19/2021 CLINICAL DATA:  Post thoracentesis EXAM: CHEST  1 VIEW COMPARISON:  Exam at 1653 hours compared to 07/17/2021 FINDINGS: LEFT jugular Port-A-Cath with tip projecting over RIGHT atrium. Normal heart size. Extensive BILATERAL pulmonary infiltrates. No definite pleural effusion or pneumothorax. Chronic osseous expansion of the posterior LEFT fifth rib, patient with known osseous metastatic disease. IMPRESSION: Persistent pulmonary infiltrates. No pneumothorax. Electronically Signed   By: Lavonia Dana M.D.   On: 07/19/2021 19:14   DG CHEST PORT 1 VIEW  Result Date: 07/21/2021 CLINICAL DATA:  Respiratory distress EXAM: PORTABLE CHEST 1 VIEW COMPARISON:  July 19, 2021 FINDINGS: The mediastinal contour and cardiac silhouette are stable. Left central venous line is unchanged. Patchy consolidations identified in the bilateral upper and mid lung, to a lesser degree in the left lower lung, not significantly changed compared prior exam. Osseous expansion of the  left fifth rib is unchanged. IMPRESSION: Patchy consolidations identified in the bilateral lungs, not significantly changed compared prior exam. Electronically Signed   By: Abelardo Diesel M.D.   On: 07/21/2021 08:29   US THORACENTESIS ASP PLEURAL SPACE W/IMG GUIDE  Result Date: 07/19/2021 INDICATION: Patient is metastatic lung cancer with left pleural effusion. Request therapeutic thoracentesis. EXAM: ULTRASOUND GUIDED LEFT THORACENTESIS MEDICATIONS: 1% plain lidocaine, 1 mL COMPLICATIONS: None immediate. PROCEDURE: An ultrasound guided thoracentesis was thoroughly discussed with the patient and questions answered. The benefits, risks, alternatives and complications were also discussed. The patient understands and  wishes to proceed with the procedure. Written consent was obtained. Ultrasound was performed to localize and mark an adequate pocket of fluid in the left chest. The area was then prepped and draped in the normal sterile fashion. 1% Lidocaine was used for local anesthesia. Under ultrasound guidance a 6 Fr Safe-T-Centesis catheter was introduced. Thoracentesis was performed. The catheter was removed and a dressing applied. FINDINGS: A total of approximately 400 mL of clear yellow fluid was removed. IMPRESSION: Successful ultrasound guided left thoracentesis yielding 400 mL of pleural fluid. Read by Ascencion Dike PA-C Electronically Signed   By: Aletta Edouard M.D.   On: 07/19/2021 16:54

## 2021-07-21 NOTE — Progress Notes (Signed)
Droplet precautions discontinued as per protocol. Respiratory panel negative.

## 2021-07-22 DIAGNOSIS — J9601 Acute respiratory failure with hypoxia: Secondary | ICD-10-CM | POA: Diagnosis not present

## 2021-07-22 LAB — BASIC METABOLIC PANEL
Anion gap: 9 (ref 5–15)
BUN: 29 mg/dL — ABNORMAL HIGH (ref 8–23)
CO2: 30 mmol/L (ref 22–32)
Calcium: 9.1 mg/dL (ref 8.9–10.3)
Chloride: 99 mmol/L (ref 98–111)
Creatinine, Ser: 1.28 mg/dL — ABNORMAL HIGH (ref 0.44–1.00)
GFR, Estimated: 46 mL/min — ABNORMAL LOW (ref >60)
Glucose, Bld: 178 mg/dL — ABNORMAL HIGH (ref 70–99)
Potassium: 4.2 mmol/L (ref 3.5–5.1)
Sodium: 138 mmol/L (ref 135–145)

## 2021-07-22 LAB — CBC WITH DIFFERENTIAL/PLATELET
Abs Immature Granulocytes: 0.85 10*3/uL — ABNORMAL HIGH (ref 0.00–0.07)
Basophils Absolute: 0.1 10*3/uL (ref 0.0–0.1)
Basophils Relative: 0 %
Eosinophils Absolute: 0 10*3/uL (ref 0.0–0.5)
Eosinophils Relative: 0 %
HCT: 25.6 % — ABNORMAL LOW (ref 36.0–46.0)
Hemoglobin: 8 g/dL — ABNORMAL LOW (ref 12.0–15.0)
Immature Granulocytes: 3 %
Lymphocytes Relative: 2 %
Lymphs Abs: 0.4 10*3/uL — ABNORMAL LOW (ref 0.7–4.0)
MCH: 30.8 pg (ref 26.0–34.0)
MCHC: 31.3 g/dL (ref 30.0–36.0)
MCV: 98.5 fL (ref 80.0–100.0)
Monocytes Absolute: 0.5 10*3/uL (ref 0.1–1.0)
Monocytes Relative: 2 %
Neutro Abs: 25 10*3/uL — ABNORMAL HIGH (ref 1.7–7.7)
Neutrophils Relative %: 93 %
Platelets: 233 10*3/uL (ref 150–400)
RBC: 2.6 MIL/uL — ABNORMAL LOW (ref 3.87–5.11)
RDW: 16.7 % — ABNORMAL HIGH (ref 11.5–15.5)
WBC: 26.8 10*3/uL — ABNORMAL HIGH (ref 4.0–10.5)
nRBC: 0 % (ref 0.0–0.2)

## 2021-07-22 LAB — GLUCOSE, CAPILLARY
Glucose-Capillary: 126 mg/dL — ABNORMAL HIGH (ref 70–99)
Glucose-Capillary: 170 mg/dL — ABNORMAL HIGH (ref 70–99)
Glucose-Capillary: 200 mg/dL — ABNORMAL HIGH (ref 70–99)
Glucose-Capillary: 161 mg/dL — ABNORMAL HIGH (ref 70–99)

## 2021-07-22 LAB — PROCALCITONIN: Procalcitonin: 0.11 ng/mL

## 2021-07-22 MED ORDER — INSULIN GLARGINE-YFGN 100 UNIT/ML ~~LOC~~ SOLN
5.0000 [IU] | Freq: Every day | SUBCUTANEOUS | Status: DC
  Administered 2021-07-22 – 2021-07-23 (×2): 5 [IU] via SUBCUTANEOUS
  Filled 2021-07-22 (×3): qty 0.05

## 2021-07-22 NOTE — Progress Notes (Signed)
PROGRESS NOTE  Kelli Acosta  DOB: 1956/04/03  PCP: System, Provider Not In [id number]  DOA: 07/12/2021  LOS: 10 days  Hospital Day: 43   Chief Complaint  Patient presents with   Shortness of Breath   Fever    Brief narrative: Kelli Acosta is a 65 y.o. 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  Patient is from out of town, currently visiting her daughter and follows up with oncologist Dr. Ralene Bathe. Patient presented to the ED on 07/12/2021 with complaint of progressively worsening dyspnea for several days. CT scan obtained on admission showed multifocal pneumonia and hence was admitted to hospital service. Her respiratory status was improving however 9/4, she started to get worse and is currently on 6 L oxygen by nasal cannula.     Subjective: Patient was seen and examined this morning. Lying down on bed.  On 3 L oxygen by nasal cannula.  Which is better than yesterday.  Patient states he feels somewhat better.  Remains on high dose IV steroids. We discussed about palliative care and hospice.  Patient would like to continue steroids at this time and see how she improves in the next day or 2.  Assessment/Plan: Acute on chronic respiratory failure with hypoxia -Chronically on 2 L oxygen by nasal cannula in the setting of COPD and stage IV lung cancer -Acute respiratory decompensation probably from a combination of pneumonia, pulm edema and pleural effusion.  -Despite the use of IV antibiotics for more than a week, bilateral thoracentesis as well as significant diuresis with IV Lasix, patient continued to remain hypoxic and is currently requiring 6 L oxygen by nasal cannula.  On 9/10, pulmonary consultation was called.  Additional suspicion of interstitial pneumonitis related to chemotherapy agents.  She was started on IV Solu-Medrol 80 mg every 6 hours.  She seems to be getting some improvement.  However long-term  improvement is probably a long shot because of her significant comorbidities. -Blood pressure stable, creatinine stable. -Wean down oxygen as tolerated.  Sepsis secondary to multifocal pneumonia in the setting of immunocompromise status -Completed a 1 week course of IV cefepime. -Sepsis physiology improved. No fever.  WBC count improving.  Procalcitonin level improving. -No growth in blood cultures so far.  Repeated chest x-rays done so far continue to show persistent pulm infiltrates  Recent Labs  Lab 07/17/21 0355 07/18/21 0315 07/19/21 0512 07/20/21 0750 07/21/21 0407 07/22/21 0430  WBC 26.4* 22.9* 14.1*  --  22.3* 26.8*  PROCALCITON  --   --   --  0.29 0.20 0.11   Stage IV lung cancer with mets to brain COPD -Patient follows up with oncologist Dr. Ralene Bathe [phone]) at her hometown.  On prednisone 10 mg daily at home.  Not on chemotherapy recently.  Last radiation was in February 2022. -Currently on IV Solu-Medrol. -Continue bronchodilators.  Anemia of chronic disease -Macrocytic.  No active bleeding.  Hemoglobin stable Recent Labs    07/17/21 0355 07/18/21 0315 07/19/21 0512 07/21/21 0407 07/22/21 0430  HGB 7.8* 7.6* 7.3* 7.9* 8.0*  MCV 98.8 100.0 100.4* 100.4* 98.5   Type 2 diabetes mellitus -A1c at 5.2 on 07/13/2021 -Currently on sliding scale insulin with Accu-Cheks.  Blood sugar level has been consistently less than 200.  Gradually rising with the use of high-dose steroids.  I would give her Lantus 5 units this morning. Recent Labs  Lab 07/21/21 1114 07/21/21 1640 07/21/21 1929 07/22/21  0730 07/22/21 1205  GLUCAP 218* 273* 199* 126* 170*   Hypokalemia -Improved with replacement. Recent Labs  Lab 07/18/21 0315 07/19/21 0512 07/20/21 0750 07/21/21 0407 07/22/21 0430  K 3.5 3.3* 3.9 4.6 4.2   Chronic constipation -Continue bowel regimen   Depression -continue Zoloft, Lunesta  Goals of care -Remains full code at this time.  I discussed with  patient this morning if she would consider palliative care and hospice.  Patient is aware of the poor prognosis of her condition.  She states that she was under hospice services in the past but the nurse abruptly quit. At this time, she hopes to get better with high-dose steroids.  She would like to take her time to think about palliative care and hospice in next 1 to 2 days.  Mobility: PT eval obtained.  Home health PT recommended Code Status:   Code Status: Full Code  Nutritional status: Body mass index is 27.37 kg/m. Nutrition Problem: Inadequate oral intake Etiology: nausea, vomiting Signs/Symptoms: per patient/family report Diet:  Diet Order             Diet Carb Modified Fluid consistency: Thin; Room service appropriate? Yes  Diet effective now                  DVT prophylaxis:  enoxaparin (LOVENOX) injection 40 mg Start: 07/13/21 1300 SCDs Start: 07/12/21 1032   Antimicrobials: Completed the course of antibiotics Fluid: None Consultants: Pulmonology Family Communication: Daughter not at bedside today.  Status is: Inpatient  Remains inpatient appropriate because: Continues to be dyspneic Dispo: The patient is from: Home              Anticipated d/c is to: Hopefully home with home health PT in 1 to 2 days              Patient currently is not medically stable to d/c.   Difficult to place patient No     Infusions:     Scheduled Meds:  Budeson-Glycopyrrol-Formoterol  2 puff Inhalation BID   calcium carbonate  1 tablet Oral TID WC   Chlorhexidine Gluconate Cloth  6 each Topical Daily   cholecalciferol  1,000 Units Oral Daily   dexmethylphenidate  5 mg Oral BID   enoxaparin (LOVENOX) injection  40 mg Subcutaneous Q24H   feeding supplement  1 Container Oral TID BM   folic acid  1 mg Oral BID   guaiFENesin  600 mg Oral BID   insulin aspart  0-6 Units Subcutaneous TID WC   insulin glargine-yfgn  5 Units Subcutaneous Daily   ipratropium-albuterol  3 mL  Nebulization TID   methylPREDNISolone (SOLU-MEDROL) injection  80 mg Intravenous Q6H   multivitamin with minerals  1 tablet Oral Daily   pantoprazole  40 mg Oral Daily   polyethylene glycol  17 g Oral Daily   psyllium  1 packet Oral BID   senna  1 tablet Oral Daily   sertraline  100 mg Oral Daily   Vitamin E  400 Units Oral Daily    Antimicrobials: Anti-infectives (From admission, onward)    Start     Dose/Rate Route Frequency Ordered Stop   07/14/21 1400  ceFEPIme (MAXIPIME) 2 g in sodium chloride 0.9 % 100 mL IVPB        2 g 200 mL/hr over 30 Minutes Intravenous Every 8 hours 07/14/21 1056 07/18/21 2310   07/14/21 1200  vancomycin (VANCOCIN) IVPB 1000 mg/200 mL premix  Status:  Discontinued  1,000 mg 200 mL/hr over 60 Minutes Intravenous Every 24 hours 07/13/21 1037 07/14/21 1049   07/13/21 1130  vancomycin (VANCOREADY) IVPB 1500 mg/300 mL        1,500 mg 150 mL/hr over 120 Minutes Intravenous  Once 07/13/21 1036 07/13/21 1421   07/12/21 1800  ceFEPIme (MAXIPIME) 2 g in sodium chloride 0.9 % 100 mL IVPB  Status:  Discontinued        2 g 200 mL/hr over 30 Minutes Intravenous Every 12 hours 07/12/21 1049 07/14/21 1056   07/12/21 1045  ceFEPIme (MAXIPIME) 2 g in sodium chloride 0.9 % 100 mL IVPB  Status:  Discontinued        2 g 200 mL/hr over 30 Minutes Intravenous  Once 07/12/21 1031 07/12/21 1033   07/12/21 0645  ceFEPIme (MAXIPIME) 2 g in sodium chloride 0.9 % 100 mL IVPB        2 g 200 mL/hr over 30 Minutes Intravenous  Once 07/12/21 0633 07/12/21 0737       PRN meds: acetaminophen **OR** acetaminophen, albuterol, ALPRAZolam, Glycerin (Adult), guaiFENesin, hydrOXYzine, lip balm, menthol-cetylpyridinium, ondansetron **OR** ondansetron (ZOFRAN) IV, oxyCODONE, prochlorperazine, sodium chloride flush, zolpidem   Objective: Vitals:   07/22/21 0734 07/22/21 0753  BP:  (!) 137/59  Pulse:  85  Resp:  16  Temp:  98.2 F (36.8 C)  SpO2: 98% 98%    Intake/Output  Summary (Last 24 hours) at 07/22/2021 1222 Last data filed at 07/22/2021 0800 Gross per 24 hour  Intake --  Output 2900 ml  Net -2900 ml   Filed Weights   07/12/21 0651 07/18/21 1643  Weight: 71.2 kg 74.6 kg   Weight change:  Body mass index is 27.37 kg/m.   Physical Exam: General exam: Pleasant, middle-aged Caucasian female.  Looks weak.  Not in distress at rest but dizzy and lightheaded on getting up. Skin: No rashes, lesions or ulcers. HEENT: Atraumatic, normocephalic, no obvious bleeding Lungs: Mild end expiratory wheezing bilaterally.  Continues to have cough on deep breathing. CVS: Regular rate and rhythm, no murmur GI/Abd soft, nontender, nondistended, bowel sound present CNS: Alert, awake, oriented x3 Psychiatry: Depressed look Extremities: No pedal edema, no calf tenderness  Data Review: I have personally reviewed the laboratory data and studies available.  Recent Labs  Lab 07/17/21 0355 07/18/21 0315 07/19/21 0512 07/21/21 0407 07/22/21 0430  WBC 26.4* 22.9* 14.1* 22.3* 26.8*  NEUTROABS 24.8* 21.4* 12.9* 21.4* 25.0*  HGB 7.8* 7.6* 7.3* 7.9* 8.0*  HCT 25.0* 24.7* 24.0* 25.7* 25.6*  MCV 98.8 100.0 100.4* 100.4* 98.5  PLT 219 216 186 196 233   Recent Labs  Lab 07/18/21 0315 07/19/21 0512 07/20/21 0500 07/20/21 0750 07/21/21 0407 07/22/21 0430  NA 143 137  --  140 137 138  K 3.5 3.3*  --  3.9 4.6 4.2  CL 108 102  --  105 100 99  CO2 29 29  --  28 27 30   GLUCOSE 103* 121*  --  97 199* 178*  BUN 21 23  --  18 21 29*  CREATININE 0.92 0.93 0.92 1.03* 1.04* 1.28*  CALCIUM 8.3* 8.0*  --  8.4* 8.6* 9.1    F/u labs ordered Unresulted Labs (From admission, onward)     Start     Ordered   07/21/21 0500  CBC with Differential/Platelet  Daily,   R     Question:  Specimen collection method  Answer:  IV Team=IV Team collect   07/20/21 0752   07/21/21 0500  Basic metabolic panel  Daily,   R     Question:  Specimen collection method  Answer:  IV Team=IV Team  collect   07/20/21 0752   07/20/21 0500  Creatinine, serum  (enoxaparin (LOVENOX)    CrCl >/= 30 ml/min)  Weekly,   R     Comments: while on enoxaparin therapy   Question:  Specimen collection method  Answer:  IV Team=IV Team collect   07/13/21 1110            Signed, Terrilee Croak, MD [facility] 07/22/2021

## 2021-07-22 NOTE — Progress Notes (Signed)
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  Care is by [facility] system in [place].  Diagnosis of lung cancer for 2-1/2 yars ago.  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  This is according to history.  Review of the chart shows that she had a PET scan early November 2021 that shows multifocal axial and proximal appendicular osseous metastasis involving the calvarium, left fifth rib, spine mostly intense at the left L1, right proximal humerus and pelvis and bilateral ribs.  In December 2021 and February 2022 was diagnosed with multiple brain mets according to history.  She saw Dr. Franchot Heidelberg land at [place] and had CyberKnife treatment somewhere along this time her Beryle Flock was stopped according to the patient history on 07/20/2021 [facility] review shows she was on it along with prednisone as of April 15, 2021].  At this time she also had skeletal metastasis.  She was treated with gamma knife to the brain and also bilateral chest radiation lasting 6 weeks.  Then subsequently she said she had a lot of fatigue and tiredness and sleepiness.  She was diagnosed to have nocturnal hypoxemia and started on night oxygen sometime in May 2022.  Then in May-June 2022 she saw pulmonary Dr. Carl Best of the [facility] in [place], [place]..  She was started on 24/7 oxygen 2 L after that her fatigue and sleepiness improved.  Of note: Emphysema is noted in the chart and she is on Breztri but details of pulmonary function testing is not available.  Her most recent  chemotherapy was on 07/08/2021.  Then she was visiting her family in [place], [place] and then on 07/11/2021 she developed chills nausea vomiting and fever.  She was admitted on 07/12/2021.  Found to have SIRS physiology with tachypnea tachycardia leukocytosis and lactic acidosis mild of 2.8 that quickly resolved in 1 day.   Course in the hospital: She had a peak white count of 34.4 thousand on 07/14/2021 [admit white count was 18,005].  She has been afebrile all along except for slight low-grade fever on admit 07/12/2021.  Her white count is improved to 14,100.  In terms of her hypoxemia  - on 07/13/2021 she only required 2 L nasal cannula but she was found to be wheezing and her oral prednisone of 10 mg converted to IV steroids.  -  On 07/14/2021: Vancomycin was stopped.   = On 07/15/2021 oxygen was increased to 4 L nasal cannula after coughing spells.  Diffuse wheezing and accessory muscle use continued.  Was noticed that after enema her respiratory status actually improved.   - On 07/16/2021 oxygenation worsened to 6 L she was given IV Lasix.  - On 07/17/2021: Culture negative still hypoxemic at 6 L and still wheezing.  Status post 700 mL right-sided thoracentesis on the right side..  Continue Lasix -On 07/18/2021: Slight improvement in hypoxemia to 4 L -07/19/2021: Left-sided thoracentesis for 100 cc -19/10/22: Still requiring 6 L wheezing and pulmonary consulted.  Cultures negative so far.  Continues to be afebrile.  White count improved to 14,000  Her current antibiotics include Cefepime 07/12/2021 - 07/14/2021 Vancomycin 07/13/2021 - 07/14/2021 Cefepime 07/14/2021-[07/18/2021]     Pertinent  Medical History   Significant Hospital Events: Including procedures, antibiotic start and stop dates in addition to other pertinent events   Admit - 07/12/2021 07/20/21 - ccm consult. sTart IV steroids  Interim History / Subjective:   Feeling a little improved today. Desat to 86% when getting up to walk to the  restroom  Objective   Blood pressure (!) 137/59, pulse 85, temperature 98.2 F (36.8 C), temperature source Oral, resp. rate 16, height 5\' 5"  (1.651 m), weight 74.6 kg, SpO2 98 %.        Intake/Output Summary (Last 24 hours) at 07/22/2021 1318 Last data filed at 07/22/2021 0800 Gross per 24 hour  Intake --  Output 2300 ml  Net -2300 ml   Filed Weights   07/12/21 0651 07/18/21 1643  Weight: 71.2 kg 74.6 kg    Examination: General appearance: 65 y.o., female, NAD, conversant  Eyes: anicteric sclerae, moist conjunctivae; no lid-lag; PERRL, tracking appropriately HENT: NCAT; oropharynx, dry MM Neck: Trachea midline; no lymphadenopathy, no JVD Lungs: rhonchorous, with normal respiratory effort CV: RRR, no MRGs  Abdomen: Soft, non-tender; non-distended, BS present  Extremities: No peripheral edema, radial and DP pulses present bilaterally  Skin: Normal temperature, turgor and texture; no rash Psych: Appropriate affect Neuro: Alert and oriented to person and place, no focal deficit   WBC 26.8 S Cr 1.28  Resolved Hospital Problem list   x  Assessment & Plan:   Acute on chronic hypoxic respiratory failure: Likely due to checkpoint inhibitor vs radiation/recall pneumonitis superimposed on background of emphysema. Baseline O2 requirement of 2-3L. Subjectively improving on steroids.  Plan -  -decrease to prednisone 70 mg daily starting tomorrow with plan to taper 10 mg each week until she reaches 40 mg daily has follow up with her oncologist. She should continue 40 mg daily at least until she has clinic follow up at which point decision regarding further taper can be arranged.  -this would represent a grade 3 pneumonitis and should probably prompt discontinuation of keytruda indefinitely -check G6PD, if negative then start dapsone 100 mg daily for PJP ppx (sulfa allergy listed) -Overall prognosis poor: Consider LTAC v home with PT/[facility] (does not want to go back to  [facility]) -Continue bronchodilator regimen  Other issues  - Severe protein calorie malnutrition: Prior to & Present on Admit -Failure to thrive: Prior to & Present on Admit - anemia: Prior to & Present on Admit - Physical deconditoning: Prior to & Present on Pablo - According to the hospitalist  Best Practice (right click and "Reselect all SmartList Selections" daily)   According to the hospitalist  SIGNATURE   Will sign off but glad to be reinvolved as condition changes.  Fredirick Maudlin Pulmonary/Critical Care   1:18 PM 07/22/2021

## 2021-07-22 NOTE — Care Management Important Message (Signed)
Important Message  Patient Details IM Letter given to the Patient. Name: Kelli Acosta MRN: [id number] Date of Birth: 07/12/1956   Medicare Important Message Given:  Yes     Kerin Salen 07/22/2021, 3:18 PM

## 2021-07-23 DIAGNOSIS — J9601 Acute respiratory failure with hypoxia: Secondary | ICD-10-CM | POA: Diagnosis not present

## 2021-07-23 LAB — BASIC METABOLIC PANEL
Anion gap: 5 (ref 5–15)
BUN: 32 mg/dL — ABNORMAL HIGH (ref 8–23)
CO2: 31 mmol/L (ref 22–32)
Calcium: 8.9 mg/dL (ref 8.9–10.3)
Chloride: 100 mmol/L (ref 98–111)
Creatinine, Ser: 1.03 mg/dL — ABNORMAL HIGH (ref 0.44–1.00)
GFR, Estimated: >60 mL/min (ref >60)
Glucose, Bld: 162 mg/dL — ABNORMAL HIGH (ref 70–99)
Potassium: 4.6 mmol/L (ref 3.5–5.1)
Sodium: 136 mmol/L (ref 135–145)

## 2021-07-23 LAB — CBC WITH DIFFERENTIAL/PLATELET
Abs Immature Granulocytes: 1 10*3/uL — ABNORMAL HIGH (ref 0.00–0.07)
Basophils Absolute: 0.1 10*3/uL (ref 0.0–0.1)
Basophils Relative: 0 %
Eosinophils Absolute: 0 10*3/uL (ref 0.0–0.5)
Eosinophils Relative: 0 %
HCT: 25.2 % — ABNORMAL LOW (ref 36.0–46.0)
Hemoglobin: 7.7 g/dL — ABNORMAL LOW (ref 12.0–15.0)
Immature Granulocytes: 4 %
Lymphocytes Relative: 1 %
Lymphs Abs: 0.3 10*3/uL — ABNORMAL LOW (ref 0.7–4.0)
MCH: 30.6 pg (ref 26.0–34.0)
MCHC: 30.6 g/dL (ref 30.0–36.0)
MCV: 100 fL (ref 80.0–100.0)
Monocytes Absolute: 0.4 10*3/uL (ref 0.1–1.0)
Monocytes Relative: 2 %
Neutro Abs: 22.5 10*3/uL — ABNORMAL HIGH (ref 1.7–7.7)
Neutrophils Relative %: 93 %
Platelets: 224 10*3/uL (ref 150–400)
RBC: 2.52 MIL/uL — ABNORMAL LOW (ref 3.87–5.11)
RDW: 17 % — ABNORMAL HIGH (ref 11.5–15.5)
WBC: 24.3 10*3/uL — ABNORMAL HIGH (ref 4.0–10.5)
nRBC: 0 % (ref 0.0–0.2)

## 2021-07-23 LAB — GLUCOSE, CAPILLARY
Glucose-Capillary: 141 mg/dL — ABNORMAL HIGH (ref 70–99)
Glucose-Capillary: 206 mg/dL — ABNORMAL HIGH (ref 70–99)
Glucose-Capillary: 189 mg/dL — ABNORMAL HIGH (ref 70–99)
Glucose-Capillary: 158 mg/dL — ABNORMAL HIGH (ref 70–99)

## 2021-07-23 MED ORDER — PREDNISONE 50 MG PO TABS
70.0000 mg | ORAL_TABLET | Freq: Every day | ORAL | Status: DC
  Administered 2021-07-24: 70 mg via ORAL
  Filled 2021-07-23: qty 1

## 2021-07-23 MED ORDER — OXYCODONE HCL 5 MG PO TABS
10.0000 mg | ORAL_TABLET | ORAL | Status: DC | PRN
  Administered 2021-07-23 – 2021-07-24 (×6): 10 mg via ORAL
  Filled 2021-07-23 (×6): qty 2

## 2021-07-23 MED ORDER — FUROSEMIDE 20 MG PO TABS
20.0000 mg | ORAL_TABLET | Freq: Every day | ORAL | Status: DC
  Administered 2021-07-24: 20 mg via ORAL
  Filled 2021-07-23: qty 1

## 2021-07-23 MED ORDER — SENNA 8.6 MG PO TABS
3.0000 | ORAL_TABLET | Freq: Every day | ORAL | Status: DC
  Filled 2021-07-23: qty 3

## 2021-07-23 NOTE — Plan of Care (Signed)
°  Problem: Respiratory: °Goal: Ability to maintain adequate ventilation will improve °Outcome: Progressing °  °Problem: Coping: °Goal: Level of anxiety will decrease °Outcome: Progressing °  °

## 2021-07-23 NOTE — Progress Notes (Signed)
PROGRESS NOTE  Kelli Acosta  DOB: 1956-04-29  PCP: System, Provider Not In [id number]  DOA: 07/12/2021  LOS: 11 days  Hospital Day: 12   Chief Complaint  Patient presents with   Shortness of Breath   Fever    Brief narrative: Kelli Acosta is a 65 y.o. 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  Patient is from out of town, currently visiting her daughter and follows up with oncologist Dr. Ralene Acosta. Patient presented to the ED on 07/12/2021 with complaint of progressively worsening dyspnea for several days. CT scan obtained on admission showed multifocal pneumonia and hence was admitted to hospital service. Respiratory status initially improved a little bit but then started to worsen again requiring up to 6 L by nasal cannula currently slowly improving and is on 3 L.  Subjective: Patient was seen and examined this morning. Sitting up in chair.  On 3 L oxygen by nasal collar.  Looks very tired out. Does not feel comfortable going home.  We had a long discussion about her poor prognosis.  We discussed about hospice services.  She states she is not thinking of considering hospice yet.  She is feeling better on steroids and would like to continue the same at this time.  Assessment/Plan: Acute on chronic respiratory failure with hypoxia -Chronically on 2 L oxygen by nasal cannula in the setting of COPD and stage IV lung cancer -Acute respiratory decompensation probably from a combination of lung cancer, pneumonia, pulm edema, pleural effusion as well as suspected lung injury from [facility]. -Despite the use of IV antibiotics for more than a week, significant diuresis with IV Lasix and bilateral thoracentesis, patient has difficulty maintaining oxygen saturation even with minimal exertion.  At rest, she is requiring 3 L which is an improvement last 48 hours from 6 L.  This improvement is probably attributable to high-dose IV  steroids per pulmonary recommendation.  As recommended, I have switched her to prednisone 70 mg daily with a plan to taper down by 10 mg a week.  After 3 weeks, patient should continue 40 mg daily at least until she has a clinic follow-up appointment with pulmonologist. -On 9/12, G6PD test was sent by pulmonology.  If negative, can start on dapsone 100 mg daily for PCP prophylaxis. -Patient states she is feeling some improvement with IV steroids.  However, long-term improvement is probably a long shot because of the severity of her cancer and respiratory compensation. -Blood pressure stable, creatinine stable.  Prior to admission, she was on Lasix 20 mg daily.  I would resume oral Lasix 20-minute daily today. -Wean down oxygen as tolerated.  Sepsis secondary to multifocal pneumonia in the setting of immunocompromise status -Completed a 1 week course of IV cefepime.   -Sepsis physiology improved. No fever.  WBC count improving.  Procalcitonin level improving. -No growth in blood cultures so far.  Repeated chest x-rays done so far continue to show persistent pulmonary infiltrates  Recent Labs  Lab 07/18/21 0315 07/19/21 0512 07/20/21 0750 07/21/21 0407 07/22/21 0430 07/23/21 0255  WBC 22.9* 14.1*  --  22.3* 26.8* 24.3*  PROCALCITON  --   --  0.29 0.20 0.11  --    Stage IV lung cancer with mets to brain COPD -Patient follows up with oncologist Dr. Ralene Acosta [phone]) at [place] hometown.  Was on prednisone 10 mg daily at home.  Not on chemotherapy recently.  Last radiation was  in February 2022. -Currently on prednisone 70 mg daily with a plan to taper slowly. -Continue bronchodilators. -At home, patient was not as needed oxycodone 15 mg every 4 hours.  Continue pain regimen.  Anemia of chronic disease -Macrocytic.  No active bleeding.  Hemoglobin stable Recent Labs    07/18/21 0315 07/19/21 0512 07/21/21 0407 07/22/21 0430 07/23/21 0255  HGB 7.6* 7.3* 7.9* 8.0* 7.7*  MCV 100.0 100.4*  100.4* 98.5 100.0   Type 2 diabetes mellitus -A1c at 5.2 on 07/13/2021 -Currently on Lantus 5 units daily and sliding scale insulin.  Monitor while on steroids. Recent Labs  Lab 07/22/21 1205 07/22/21 1624 07/22/21 2142 07/23/21 0738 07/23/21 1134  GLUCAP 170* 200* 161* 141* 206*   Hypokalemia -Improved with replacement. Recent Labs  Lab 07/19/21 0512 07/20/21 0750 07/21/21 0407 07/22/21 0430 07/23/21 0255  K 3.3* 3.9 4.6 4.2 4.6   Chronic constipation -Continue bowel regimen.  She states she takes Senokot 3 times a day.  Continue MiraLAX.  She does not want enema today.  Consider enema tomorrow if no bowel movement.   Depression -continue Zoloft, Lunesta  Goals of care -Remains full code at this time.  I discussed with patient this morning if she would consider palliative care and hospice.  Patient is aware of the poor prognosis of her condition.  She states that she was under hospice services in the past but the nurse abruptly quit. At this time, she hopes to get better with high-dose steroids.  She would like to take her time to think about palliative care and hospice in next 1 to 2 days.  Mobility: PT eval obtained.  Home health PT recommended Code Status:   Code Status: Full Code  Nutritional status: Body mass index is 27.37 kg/m. Nutrition Problem: Inadequate oral intake Etiology: nausea, vomiting Signs/Symptoms: per patient/family report Diet:  Diet Order             Diet Carb Modified Fluid consistency: Thin; Room service appropriate? Yes  Diet effective now                  DVT prophylaxis:  enoxaparin (LOVENOX) injection 40 mg Start: 07/13/21 1300 SCDs Start: 07/12/21 1032   Antimicrobials: Completed the course of antibiotics Fluid: None Consultants: Pulmonology Family Communication: Daughter not at bedside today.  Status is: Inpatient  Remains inpatient appropriate because: Continues to be dyspneic Dispo: The patient is from: Home               Anticipated d/c is to: Hopefully home with home health PT in 1 to 2 days              Patient currently is not medically stable to d/c.   Difficult to place patient No     Infusions:     Scheduled Meds:  Budeson-Glycopyrrol-Formoterol  2 puff Inhalation BID   calcium carbonate  1 tablet Oral TID WC   Chlorhexidine Gluconate Cloth  6 each Topical Daily   cholecalciferol  1,000 Units Oral Daily   dexmethylphenidate  5 mg Oral BID   enoxaparin (LOVENOX) injection  40 mg Subcutaneous Q24H   feeding supplement  1 Container Oral TID BM   folic acid  1 mg Oral BID   furosemide  20 mg Oral Daily   guaiFENesin  600 mg Oral BID   insulin aspart  0-6 Units Subcutaneous TID WC   insulin glargine-yfgn  5 Units Subcutaneous Daily   ipratropium-albuterol  3 mL Nebulization TID  multivitamin with minerals  1 tablet Oral Daily   pantoprazole  40 mg Oral Daily   polyethylene glycol  17 g Oral Daily   [START ON 07/24/2021] predniSONE  70 mg Oral Q breakfast   psyllium  1 packet Oral BID   [START ON 07/24/2021] senna  3 tablet Oral Daily   sertraline  100 mg Oral Daily   Vitamin E  400 Units Oral Daily    Antimicrobials: Anti-infectives (From admission, onward)    Start     Dose/Rate Route Frequency Ordered Stop   07/14/21 1400  ceFEPIme (MAXIPIME) 2 g in sodium chloride 0.9 % 100 mL IVPB        2 g 200 mL/hr over 30 Minutes Intravenous Every 8 hours 07/14/21 1056 07/18/21 2310   07/14/21 1200  vancomycin (VANCOCIN) IVPB 1000 mg/200 mL premix  Status:  Discontinued        1,000 mg 200 mL/hr over 60 Minutes Intravenous Every 24 hours 07/13/21 1037 07/14/21 1049   07/13/21 1130  vancomycin (VANCOREADY) IVPB 1500 mg/300 mL        1,500 mg 150 mL/hr over 120 Minutes Intravenous  Once 07/13/21 1036 07/13/21 1421   07/12/21 1800  ceFEPIme (MAXIPIME) 2 g in sodium chloride 0.9 % 100 mL IVPB  Status:  Discontinued        2 g 200 mL/hr over 30 Minutes Intravenous Every 12 hours 07/12/21 1049  07/14/21 1056   07/12/21 1045  ceFEPIme (MAXIPIME) 2 g in sodium chloride 0.9 % 100 mL IVPB  Status:  Discontinued        2 g 200 mL/hr over 30 Minutes Intravenous  Once 07/12/21 1031 07/12/21 1033   07/12/21 0645  ceFEPIme (MAXIPIME) 2 g in sodium chloride 0.9 % 100 mL IVPB        2 g 200 mL/hr over 30 Minutes Intravenous  Once 07/12/21 0633 07/12/21 0737       PRN meds: acetaminophen **OR** acetaminophen, albuterol, ALPRAZolam, Glycerin (Adult), guaiFENesin, hydrOXYzine, lip balm, menthol-cetylpyridinium, ondansetron **OR** ondansetron (ZOFRAN) IV, oxyCODONE, prochlorperazine, sodium chloride flush, zolpidem   Objective: Vitals:   07/23/21 0911 07/23/21 1344  BP: (!) 155/59 (!) 143/53  Pulse: 87 92  Resp: 16 18  Temp: 98.4 F (36.9 C) 98.5 F (36.9 C)  SpO2: 100% 98%    Intake/Output Summary (Last 24 hours) at 07/23/2021 1515 Last data filed at 07/23/2021 1300 Gross per 24 hour  Intake 840 ml  Output 950 ml  Net -110 ml   Filed Weights   07/12/21 0651 07/18/21 1643  Weight: 71.2 kg 74.6 kg   Weight change:  Body mass index is 27.37 kg/m.   Physical Exam: General exam: Pleasant, middle-aged Caucasian female.  Looks weak.  On 3 L oxygen at rest. Skin: No rashes, lesions or ulcers. HEENT: Atraumatic, normocephalic, no obvious bleeding Lungs: No crackles or wheezing.  Shallow respiratory forts.  Coughs on minimal deep breathing. CVS: Regular rate and rhythm, no murmur GI/Abd soft, nontender, nondistended, bowel sound present CNS: Alert, awake, oriented x3 Psychiatry: Depressed look Extremities: No pedal edema, no calf tenderness  Data Review: I have personally reviewed the laboratory data and studies available.  Recent Labs  Lab 07/18/21 0315 07/19/21 0512 07/21/21 0407 07/22/21 0430 07/23/21 0255  WBC 22.9* 14.1* 22.3* 26.8* 24.3*  NEUTROABS 21.4* 12.9* 21.4* 25.0* 22.5*  HGB 7.6* 7.3* 7.9* 8.0* 7.7*  HCT 24.7* 24.0* 25.7* 25.6* 25.2*  MCV 100.0 100.4*  100.4* 98.5 100.0  PLT 216 186  196 233 224   Recent Labs  Lab 07/19/21 0512 07/20/21 0500 07/20/21 0750 07/21/21 0407 07/22/21 0430 07/23/21 0255  NA 137  --  140 137 138 136  K 3.3*  --  3.9 4.6 4.2 4.6  CL 102  --  105 100 99 100  CO2 29  --  28 27 30 31   GLUCOSE 121*  --  97 199* 178* 162*  BUN 23  --  18 21 29* 32*  CREATININE 0.93 0.92 1.03* 1.04* 1.28* 1.03*  CALCIUM 8.0*  --  8.4* 8.6* 9.1 8.9    F/u labs ordered Unresulted Labs (From admission, onward)     Start     Ordered   07/23/21 0500  Glucose 6 phosphate dehydrogenase  Once,   R       Question:  Specimen collection method  Answer:  IV Team=IV Team collect   07/22/21 1319   07/20/21 0500  Creatinine, serum  (enoxaparin (LOVENOX)    CrCl >/= 30 ml/min)  Weekly,   R     Comments: while on enoxaparin therapy   Question:  Specimen collection method  Answer:  IV Team=IV Team collect   07/13/21 1110            Signed, Terrilee Croak, MD [facility] 07/23/2021

## 2021-07-23 NOTE — Progress Notes (Signed)
Physical Therapy Treatment Patient Details Name: Kelli Acosta MRN: [id number] DOB: 08/29/1956 Today's Date: 07/23/2021   History of Present Illness 65 yo female admitted with sepsis, back pain, acute on chronic respiratory failure. S/P thoracentesis 9/7. Hx of stage IV lung ca with mets, anemia, DM    PT Comments    Pt OOB in recliner on 3lts at rest 99%.  Pt wanting to try to amb to bathroom to have a BM.  "I have not pooped in 5 days". General transfer comment: first sit to stand B knees buckled and pt sat uncontrolled back onto recliner.  Second sit to stand pt had to use B UE's more to complete upright position.  Also assisted to elevated toilet, pt required Min Assist to complete and also required assist for peri care as she was unable to safely stand without use B UE's on walker. General Gait Details: VERY limited amb distance due to weakness/dyspnea.  Pt required 4 lts during activity to achieve sats >90%.  HR increased to 109.  Coughing increased from non to Moderate.  VERY declonditioned.  VERY unsteady.  HIGH FALL RISK.Marland Kitchen  Would NOT rec she amb on her own. Pt hopes to D/C to her daughters home here in [place] then go to her home in [place].   Pt stated "I have been dealing with this for two years" (blood CA).  CHEMO "every three weeks".   Pt stated she has a Rollator and a 3:1 at home but will need a regular RW.  I secured chat SW equipment need RW. Also instructed pt she needs 4 lts oxygen during activity and 2 lts at rest to maintain sats >90%.  Pt already on Home O2 prior to this admit.  Recommendations for follow up therapy are one component of a multi-disciplinary discharge planning process, led by the attending physician.  Recommendations may be updated based on patient status, additional functional criteria and insurance authorization.  Follow Up Recommendations  Home health PT;Supervision for mobility/OOB     Equipment Recommendations  Rolling walker with 5" wheels     Recommendations for Other Services       Precautions / Restrictions Precautions Precautions: Fall Precaution Comments: O2 dep at baseline     Mobility  Bed Mobility               General bed mobility comments: Pt OOB in recliner    Transfers Overall transfer level: Needs assistance Equipment used: Rolling walker (2 wheeled) Transfers: Sit to/from [facility] Sit to Stand: Min assist Stand pivot transfers: Min assist       General transfer comment: first sit to stand B knees buckled and pt sat uncontrolled back onto recliner.  Second sit to stand pt had to use B UE's more to complete upright position.  Also assisted to elevated toilet, pt required Min Assist to complete and also required assist for peri care as she was unable to safely stand without use B UE's on walker.  Ambulation/Gait Ambulation/Gait assistance: Min guard;Min assist Gait Distance (Feet): 8 Feet (4 feet5 x 2 to and from bathroom) Assistive device: Rolling walker (2 wheeled) Gait Pattern/deviations: Step-through pattern;Decreased stride length Gait velocity: decreased   General Gait Details: VERY limited amb distance due to weakness/dyspnea.  Pt required 4 lts during activity to achieve sats >90%.  HR increased to 109.  Coughing increased from non to Moderate.  VERY declonditioned.  VERY unsteady.  HIGH FALL RISK.Marland Kitchen  Would NOT rec she amb on her own.  Stairs             Wheelchair Mobility    Modified Rankin (Stroke Patients Only)       Balance                                            Cognition Arousal/Alertness: Awake/alert Behavior During Therapy: WFL for tasks assessed/performed                                   General Comments: AxO x 3 very sweet lady but also very fragile. "I have been dealing with this for two years" (blood CA)      Exercises      General Comments        Pertinent Vitals/Pain Pain Assessment:  Faces Faces Pain Scale: Hurts a little bit Pain Location: back Chronic "METS" Pain Descriptors / Indicators: Discomfort;Sore;Aching Pain Intervention(s): Monitored during session;Premedicated before session;Repositioned    Home Living                      Prior Function            PT Goals (current goals can now be found in the care plan section) Progress towards PT goals: Progressing toward goals    Frequency    Min 3X/week      PT Plan Current plan remains appropriate    Co-evaluation              AM-PAC PT "6 Clicks" Mobility   Outcome Measure  Help needed turning from your back to your side while in a flat bed without using bedrails?: A Little Help needed moving from lying on your back to sitting on the side of a flat bed without using bedrails?: A Little Help needed moving to and from a bed to a chair (including a wheelchair)?: A Little Help needed standing up from a chair using your arms (e.g., wheelchair or bedside chair)?: A Little Help needed to walk in hospital room?: A Lot Help needed climbing 3-5 steps with a railing? : Total 6 Click Score: 15    End of Session Equipment Utilized During Treatment: Oxygen;Gait belt Activity Tolerance: Patient limited by fatigue Patient left: in chair;with chair alarm set;with call bell/phone within reach Nurse Communication: Mobility status (very small hard BM - pt stated she has not had a BM in 5 days.) PT Visit Diagnosis: Unsteadiness on feet (R26.81);Difficulty in walking, not elsewhere classified (R26.2);Pain     Time: 1250-1318 PT Time Calculation (min) (ACUTE ONLY): 28 min  Charges:  $Gait Training: 8-22 mins $Therapeutic Activity: 8-22 mins                     Rica Koyanagi  PTA Acute  Rehabilitation Services Pager      [phone] Office      [phone]

## 2021-07-24 DIAGNOSIS — J189 Pneumonia, unspecified organism: Secondary | ICD-10-CM | POA: Diagnosis present

## 2021-07-24 DIAGNOSIS — C3490 Malignant neoplasm of unspecified part of unspecified bronchus or lung: Secondary | ICD-10-CM | POA: Diagnosis present

## 2021-07-24 DIAGNOSIS — Z9889 Other specified postprocedural states: Secondary | ICD-10-CM

## 2021-07-24 DIAGNOSIS — E876 Hypokalemia: Secondary | ICD-10-CM

## 2021-07-24 DIAGNOSIS — F32A Depression, unspecified: Secondary | ICD-10-CM | POA: Diagnosis not present

## 2021-07-24 DIAGNOSIS — K5909 Other constipation: Secondary | ICD-10-CM

## 2021-07-24 DIAGNOSIS — J9601 Acute respiratory failure with hypoxia: Secondary | ICD-10-CM | POA: Diagnosis not present

## 2021-07-24 DIAGNOSIS — E119 Type 2 diabetes mellitus without complications: Secondary | ICD-10-CM

## 2021-07-24 LAB — GLUCOSE 6 PHOSPHATE DEHYDROGENASE
G6PDH: 19.9 U/g{Hb} — ABNORMAL HIGH (ref 4.8–15.7)
Hemoglobin: 7.7 g/dL — ABNORMAL LOW (ref 11.1–15.9)

## 2021-07-24 LAB — GLUCOSE, CAPILLARY
Glucose-Capillary: 57 mg/dL — ABNORMAL LOW (ref 70–99)
Glucose-Capillary: 75 mg/dL (ref 70–99)
Glucose-Capillary: 126 mg/dL — ABNORMAL HIGH (ref 70–99)
Glucose-Capillary: 143 mg/dL — ABNORMAL HIGH (ref 70–99)

## 2021-07-24 MED ORDER — GUAIFENESIN ER 600 MG PO TB12
1200.0000 mg | ORAL_TABLET | Freq: Two times a day (BID) | ORAL | Status: DC
  Administered 2021-07-24: 1200 mg via ORAL

## 2021-07-24 MED ORDER — SENNOSIDES-DOCUSATE SODIUM 8.6-50 MG PO TABS
1.0000 | ORAL_TABLET | Freq: Two times a day (BID) | ORAL | Status: DC
  Administered 2021-07-24: 1 via ORAL

## 2021-07-24 MED ORDER — POLYETHYLENE GLYCOL 3350 17 G PO PACK: 17.0000 g | PACK | Freq: Two times a day (BID) | ORAL | 0 refills | Status: AC

## 2021-07-24 MED ORDER — POLYETHYLENE GLYCOL 3350 17 G PO PACK
17.0000 g | PACK | Freq: Two times a day (BID) | ORAL | Status: DC
  Administered 2021-07-24: 17 g via ORAL
  Filled 2021-07-24: qty 1

## 2021-07-24 MED ORDER — PROSOURCE PLUS PO LIQD: 30.0000 mL | Freq: Two times a day (BID) | ORAL | Status: AC

## 2021-07-24 MED ORDER — GUAIFENESIN ER 600 MG PO TB12: 600.0000 mg | ORAL_TABLET | Freq: Two times a day (BID) | ORAL | 0 refills | Status: AC

## 2021-07-24 MED ORDER — PSYLLIUM 95 % PO PACK: 1.0000 | PACK | Freq: Two times a day (BID) | ORAL | Status: AC

## 2021-07-24 MED ORDER — ALBUTEROL SULFATE (2.5 MG/3ML) 0.083% IN NEBU: 3.0000 mL | INHALATION_SOLUTION | Freq: Four times a day (QID) | RESPIRATORY_TRACT | 1 refills | Status: AC | PRN

## 2021-07-24 MED ORDER — CALCIUM CARBONATE ANTACID 500 MG PO CHEW: 1.0000 | CHEWABLE_TABLET | Freq: Three times a day (TID) | ORAL | Status: AC

## 2021-07-24 MED ORDER — GLUCERNA SHAKE PO LIQD: 237.0000 mL | ORAL | 0 refills | Status: AC

## 2021-07-24 MED ORDER — SENNOSIDES-DOCUSATE SODIUM 8.6-50 MG PO TABS: 1.0000 | ORAL_TABLET | Freq: Two times a day (BID) | ORAL | 1 refills | Status: AC

## 2021-07-24 MED ORDER — GLUCERNA SHAKE PO LIQD: 237.0000 mL | ORAL | Status: DC

## 2021-07-24 MED ORDER — HEPARIN SOD (PORK) LOCK FLUSH 100 UNIT/ML IV SOLN
500.0000 [IU] | INTRAVENOUS | Status: AC | PRN
  Administered 2021-07-24: 500 [IU]
  Filled 2021-07-24: qty 5

## 2021-07-24 MED ORDER — PROSOURCE PLUS PO LIQD
30.0000 mL | Freq: Two times a day (BID) | ORAL | Status: DC
  Filled 2021-07-24: qty 30

## 2021-07-24 MED ORDER — BOOST / RESOURCE BREEZE PO LIQD CUSTOM: 1.0000 | Freq: Two times a day (BID) | ORAL | Status: DC

## 2021-07-24 MED ORDER — PREDNISONE 10 MG PO TABS: ORAL_TABLET | ORAL | 0 refills | Status: AC

## 2021-07-24 NOTE — Progress Notes (Signed)
Nutrition Follow-up  DOCUMENTATION CODES:   Not applicable  INTERVENTION:  - will decrease Boost Breeze from TID to BID, each supplement provides 250 kcal and 9 grams of protein. - will order Glucerna Shake once/day, each supplement provides 220 kcal and 10 grams of protein. - will order 30 ml Prosource Plus BID, each supplement provides 100 kcal and 15 grams protein.  - weigh patient today.  - complete NFPE when feasible.    NUTRITION DIAGNOSIS:   Inadequate oral intake related to nausea, vomiting as evidenced by per patient/family report. 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   She has been eating 50-75% at meals (mainly 50%) since 9/11. She has been accepting Boost Breeze 50-75% of the time offered since order was placed on 9/10.   She was previously ordered Glucerna Shake BID and Ensure Plus once/day, but unsure of when and why these were discontinued.   She weighed 157 lb on 9/2 and 164 lb on 9/8. No other weight recordings since admission.   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  Medications reviewed; 1000 units cholecalciferol/day, 1 mg folvite BID, 20 mg oral lasix/day, sliding scale novolog, 40 mg oral protonix/day, 17 g miralax BID, 70 mg  deltasone/day, 1 packet metamucil BID, 1 tablet senokot BID, 400 units vitamin E/day.    NUTRITION - FOCUSED PHYSICAL EXAM:  Unable to complete  Diet Order:   Diet Order             Diet Carb Modified Fluid consistency: Thin; Room service appropriate? Yes  Diet effective now                   EDUCATION NEEDS:   No education needs have been identified at this time  Skin:  Skin Assessment: Reviewed RN Assessment  Last BM:  9/13 (type 1 x1)  Height:   Ht Readings from Last 1 Encounters:  07/12/21 5\' 5"  (1.651 m)    Weight:   Wt Readings from Last 1 Encounters:  07/18/21 74.6 kg     Estimated Nutritional Needs:  Kcal:  1800-2000 Protein:  90-100 grams Fluid:  >1.8L     Jarome Matin, MS, RD, LDN, CNSC Inpatient Clinical Dietitian RD pager # available in [facility]  After hours/weekend pager # available in [facility]

## 2021-07-24 NOTE — Progress Notes (Signed)
Inpatient Diabetes Program Recommendations  AACE/ADA: New Consensus Statement on Inpatient Glycemic Control (2015)  Target Ranges:  Prepandial:   less than 140 mg/dL      Peak postprandial:   less than 180 mg/dL (1-2 hours)      Critically ill patients:  140 - 180 mg/dL   Lab Results  Component Value Date   GLUCAP 75 07/24/2021   HGBA1C 5.2 07/13/2021    Review of Glycemic Control Results for Kelli Acosta, Kelli Acosta (MRN [id number]) as of 07/24/2021 11:15  Ref. Range 07/23/2021 16:30 07/23/2021 20:31 07/24/2021 08:10 07/24/2021 08:54  Glucose-Capillary Latest Ref Range: 70 - 99 mg/dL 189 (H) 158 (H) 57 (L) 75   Diabetes history: No hx noted Outpatient Diabetes medications: none Current orders for Inpatient glycemic control: Novolog 0-6 units TID Prednisone 70 mg QAM Inpatient Diabetes Program Recommendations:    Noted hypoglycemia followed by discontinuation of basal insulin. In agreement. Following.   Thanks, Bronson Curb, MSN, RNC-OB Diabetes Coordinator [phone] (8a-5p)

## 2021-07-24 NOTE — Discharge Summary (Signed)
Physician Discharge Summary  Kelli Acosta KDT:[id number] DOB: [birth year] DOA: 07/12/2021  PCP: System, Provider Not In  Admit date: 07/12/2021 Discharge date: 07/24/2021  Time spent: 60 minutes  Recommendations for Outpatient Follow-up:  Patient was discharged home with home health. Follow-up with Dr. Carlis Abbott, pulmonary in 1 week.  On follow-up steroid taper will need to be decided at that time and further management of pneumonitis. Follow-up with primary oncologist in 1 to 2 weeks. Follow-up with PCP in 2 weeks.  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 Kathreen Cornfield is a 66 y.o. 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 Presenting with dyspnea. She reports that she has had increasing dyspnea over the last several days. She has required increased use of her inhalers and nebs. She has had some cough as well. Last night she had severe chills, N/V and fever. Her daughter became concerned and called her cancer center. It was recommended that she be brought to the ED for evaluation. Of note, she had chemo on 07/08/21. Otherwise, she denies any other aggravating or alleviating factors.     ED Course: She was found to be tachypneic, tachycardic. She had elevated WBC and lactic acids. CXR showed PNA. CTA chest was ordered. She was started on fluids and abx. [facility] was called for admission.    Hospital Course:  #1 acute on chronic respiratory failure with hypoxia/probable pneumonitis -Patient noted to chronically be on 2 L O2 nasal cannula in the setting of COPD and stage IV lung cancer. -Patient respiratory decompensation felt likely secondary to possible pneumonitis from lung injury from [facility] in the setting of stage IV lung cancer, multifocal pneumonia, pulmonary edema. -Patient did receive 1 weeks worth of IV antibiotics, diuresed significantly with IV Lasix and underwent bilateral thoracentesis however initially during the hospitalization patient had difficulty maintaining O2 sats even with minimal exertion. -Patient noted to have O2 requirements going up to 6 L.  Pulmonary was consulted and high-dose IV steroids recommended. -Patient improved clinically and was requiring 3 L nasal cannula by day of discharge. -Pulmonary had recommended transitioning patient to prednisone 70 mg daily with a taper down by 10 mg a week and then once patient was on prednisone 40 mg low weekly to continue that until outpatient follow-up with pulmonary and oncology. -G6PD test was done on 9/12 per pulmonary with results pending at time of discharge.  Per pulmonary if negative could start on dapsone 100 mg daily for PCP prophylaxis which will be deferred to the outpatient setting as test pending at time of discharge. -It was recommended per pulmonary that patient not be resumed on Keytruda. -Patient improved clinically was transitioned from IV Lasix back to home dose oral Lasix 20 mg daily and be discharged in stable and improved condition with outpatient follow-up with pulmonary and oncology.  2.  Sepsis secondary to  multifocal pneumonia in the setting of immunocompromise state -Status post 1 week course of IV cefepime. -Sepsis physiology improved and had resolved by day of discharge with improvement with leukocytosis, improvement with procalcitonin, remaining afebrile. -Blood cultures obtained with  no growth to date. -Repeat chest x-ray showed some persistent pulmonary infiltrates. -Outpatient follow-up.  3.  Stage IV lung cancer with mets to the brain/COPD -Patient noted to follow up with oncologist Dr. Ralene [place] at [place]. -Patient noted to be on prednisone 10 mg daily prior to admission with last radiation February 2022. -Patient will be discharged on prednisone 70 mg daily with slow steroid taper as stated above. -Patient maintained on bronchodilators. -Patient maintained on home regimen pain medication.  4.  Anemia of chronic disease -Remained stable.  5.  Type 2 diabetes mellitus -Hemoglobin A1c 5.2 on 07/13/2021. -Patient started on Lantus as well as sliding scale insulin while on steroids however long-acting insulin was.  As patient noted to have a CBG of 57 on day of discharge however remained asymptomatic. -Outpatient follow-up with PCP.  6.  Hypokalemia -Repleted.  7.  Chronic constipation Patient placed on a bowel regimen of Senokot, MiraLAX. -Enema offered however patient refused. -Patient had some bowel movements. -Patient was discharged on a bowel regimen of MiraLAX twice daily, Senokot-S twice daily, Metamucil. -Outpatient follow-up with PCP.  8.  Depression -Remained stable. -Patient maintained on home regimen Zoloft.  9.  Goals of care -Dr.Dahal discussed with patient whether she would consider palliative care and hospice. -Patient aware of poor prognosis of her condition. -Patient had stated that she was under hospice services in the past but not abruptly quit. -At this point in time patient hopes to get better with high-dose steroids and would like to take a time to decide on palliative care versus hospice  Procedures: CT angiogram 07/12/2021 Chest x-ray 07/21/2021, 07/19/2021, 07/17/2021, 07/12/2021 Ultrasound-guided right thoracentesis with 700 cc yellow fluid removed 07/17/2021, --- per IR Ultrasound-guided left thoracentesis yielding 400 cc of yellow  fluid 07/19/2021--per IR    Consultations: Pulmonology  Discharge Exam: Vitals:   07/24/21 0800 07/24/21 1200  BP: (!) 140/57 (!) 128/58  Pulse: 97 89  Resp: 16 18  Temp: 98.3 F (36.8 C) 98.7 F (37.1 C)  SpO2: 99% 99%    General: NAD Cardiovascular: Regular rate rhythm no murmurs rubs or gallops.  No JVD.  No lower extremity edema. Respiratory: Fair air movement.  Minimal expiratory wheezing.  No rhonchi.  No crackles.  Fair air movement.  Speaking in full sentences.  Discharge Instructions   Discharge Instructions     Diet general   Complete by: As directed    Increase activity slowly   Complete by: As directed       Allergies as of 07/24/2021       Reactions   Codeine Hives, Itching   Promethazine Other (See Comments)   jittery   Sulfa Antibiotics         Medication List     TAKE these medications    albuterol 108 (90 Base) MCG/ACT inhaler Commonly known as: VENTOLIN HFA Inhale 1-2 puffs into the lungs every 4 (four) hours as needed for wheezing. What changed: Another medication with the same name was changed. Make sure you understand how and when to take each.   albuterol (2.5 MG/3ML) 0.083% nebulizer solution Commonly known as: PROVENTIL Take 3 mLs by nebulization 4 (four) times daily as needed for wheezing or shortness of breath. Use 3 times daily x 5  days then every 4 hours as needed. What changed: additional instructions   ALPRAZolam 1 MG tablet Commonly known as: XANAX Take 1 mg by mouth 2 (two) times daily as needed for anxiety.   Breztri Aerosphere 160-9-4.8 MCG/ACT Aero Generic drug: Budeson-Glycopyrrol-Formoterol Inhale 2 puffs into the lungs 2 (two) times daily.   calcium carbonate 500 MG chewable tablet Commonly known as: TUMS - dosed in mg elemental calcium Chew 1 tablet (200 mg of elemental calcium total) by mouth 3 (three) times daily with meals.   cholecalciferol 25 MCG (1000 UNIT) tablet Commonly known as: VITAMIN D3 Take  1,000 Units by mouth daily.   dexmethylphenidate 5 MG tablet Commonly known as: FOCALIN Take 5 mg by mouth 2 (two) times daily.   diazepam 10 MG tablet Commonly known as: VALIUM Take 10 mg by mouth daily as needed for anxiety.   Eszopiclone 3 MG Tabs Take 3 mg by mouth at bedtime.   (feeding supplement) PROSource Plus liquid Take 30 mLs by mouth 2 (two) times daily between meals.   feeding supplement (GLUCERNA SHAKE) Liqd Take 237 mLs by mouth daily. Start taking on: July 25, 4969   folic acid 1 MG tablet Commonly known as: FOLVITE Take 1 mg by mouth 2 (two) times daily.   furosemide 20 MG tablet Commonly known as: LASIX Take 20 mg by mouth daily.   guaiFENesin 600 MG 12 hr tablet Commonly known as: Mucinex Take 1 tablet (600 mg total) by mouth 2 (two) times daily for 7 days.   hydrOXYzine 25 MG capsule Commonly known as: VISTARIL Take 25-50 mg by mouth 3 (three) times daily as needed for itching.   multivitamin capsule Take 1 capsule by mouth daily.   ondansetron 8 MG tablet Commonly known as: ZOFRAN Take 8 mg by mouth every 8 (eight) hours as needed for nausea/vomiting.   Oxycodone HCl 10 MG Tabs Take 10-15 mg by mouth every 6 (six) hours as needed for pain.   pantoprazole 40 MG tablet Commonly known as: PROTONIX Take 40 mg by mouth daily as needed for heartburn.   polyethylene glycol 17 g packet Commonly known as: MIRALAX / GLYCOLAX Take 17 g by mouth 2 (two) times daily.   predniSONE 10 MG tablet Commonly known as: DELTASONE Take 7 tablets (70 mg total) by mouth daily with breakfast for 6 days, THEN 6 tablets (60 mg total) daily with breakfast for 7 days, THEN 5 tablets (50 mg total) daily with breakfast for 7 days, THEN 4 tablets (40 mg total) daily with breakfast for 10 days. Start taking on: July 25, 2021 What changed: See the new instructions.   prochlorperazine 10 MG tablet Commonly known as: COMPAZINE Take 10 mg by mouth every 6 (six)  hours as needed for nausea/vomiting.   psyllium 95 % Pack Commonly known as: HYDROCIL/METAMUCIL Take 1 packet by mouth 2 (two) times daily.   senna-docusate 8.6-50 MG tablet Commonly known as: Senokot-S Take 1 tablet by mouth 2 (two) times daily.   sertraline 100 MG tablet Commonly known as: ZOLOFT Take 100 mg by mouth daily.   vitamin E 180 MG (400 UNITS) capsule Generic drug: vitamin E Take 400 Units by mouth daily.               Durable Medical Equipment  (From admission, onward)           Start     Ordered   07/24/21 0836  For home use only DME 4 wheeled rolling walker with  seat  Once       Question Answer Comment  Patient needs a walker to treat with the following condition Lung cancer [facility])   Patient needs a walker to treat with the following condition Debility      07/24/21 0836   07/23/21 1337  For home use only DME Walker rolling  Once       Question Answer Comment  Walker: With [place] Wheels   Patient needs a walker to treat with the following condition Fear for personal safety      07/23/21 1337   07/19/21 1241  For home use only DME Bedside commode  Once       Question:  Patient needs a bedside commode to treat with the following condition  Answer:  Fear for personal safety   07/19/21 1242           Allergies  Allergen Reactions   Codeine Hives and Itching   Promethazine Other (See Comments)    jittery   Sulfa Antibiotics     Follow-up Information     [facility] Follow up.   Specialty: Home Health Services Why: Alvis Lemmings will follow you at your daughter's for [place] information: [place] [address] [phone]         [facility] Follow up.   Why: You Walker and  Bed side commode is from this company if you have any problems. Contact information: [place] [address] [phone]         Greg Cutter, MD. Schedule an appointment as soon as  possible for a visit in 1 week(s).   Specialty: Pulmonary Disease Why: f/u in 1 week. Contact information: [address] [place] [address] [phone]         oncology. Schedule an appointment as soon as possible for a visit in 1 week(s).   Why: f/u in 1-2 weeks.                 The results of significant diagnostics from this hospitalization (including imaging, microbiology, ancillary and laboratory) are listed below for reference.    Significant Diagnostic Studies: DG Chest 1 View  Result Date: 07/19/2021 CLINICAL DATA:  Post thoracentesis EXAM: CHEST  1 VIEW COMPARISON:  Exam at 1653 hours compared to 07/17/2021 FINDINGS: LEFT jugular Port-A-Cath with tip projecting over RIGHT atrium. Normal heart size. Extensive BILATERAL pulmonary infiltrates. No definite pleural effusion or pneumothorax. Chronic osseous expansion of the posterior LEFT fifth rib, patient with known osseous metastatic disease. IMPRESSION: Persistent pulmonary infiltrates. No pneumothorax. Electronically Signed   By: Lavonia Dana M.D.   On: 07/19/2021 19:14   DG Chest 2 View  Result Date: 07/16/2021 CLINICAL DATA:  Pneumonia J18.9 (ICD-10-CM) EXAM: CHEST - 2 VIEW COMPARISON:  07/12/2021. FINDINGS: Mildly improved aeration in the lung bases with increased interstitial and airspace opacities in the periphery of the upper lungs.Similar dense consolidation in the lateral left upper lung. Visible pneumothorax. Likely increased small to moderate bilateral pleural effusions. Similar cardiomediastinal silhouette. Left IJ approach Port-A-Cath with the tip projecting at the right atrium. IMPRESSION: 1. Mildly improved aeration in the lung bases with increased interstitial and airspace opacities in the periphery of the upper lungs. Similar dense consolidation in the lateral left upper lung. 2. Likely increased small moderate bilateral pleural effusions. 3. Incompletely imaged L1 fracture. Destructive rib lesion  better seen on prior CT. Electronically Signed  By: Margaretha Sheffield M.D.   On: 07/16/2021 12:06   DG Chest 2 View  Result Date: 07/12/2021 CLINICAL DATA:  Lung cancer with treatment at outside facility. Worsening condition overnight EXAM: CHEST - 2 VIEW COMPARISON:  None. FINDINGS: Bands of airspace type opacity over the left more than right chest with dense ovoid nodule over the left upper chest. Porta catheter from left approach with tip at the right atrium. No visible edema effusion, or pneumothorax. Artifact from EKG leads. IMPRESSION: Extensive chest opacification primarily worrisome for pneumonia, although some of this opacity may be relay the since history of lung cancer. Electronically Signed   By: Monte Fantasia M.D.   On: 07/12/2021 07:49   CT Angio Chest PE W and/or Wo Contrast  Result Date: 07/12/2021 CLINICAL DATA:  Shortness of breath, history of neoplasm in a 65 year old female. EXAM: CT ANGIOGRAPHY CHEST WITH CONTRAST TECHNIQUE: Multidetector CT imaging of the chest was performed using the standard protocol during bolus administration of intravenous contrast. Multiplanar CT image reconstructions and MIPs were obtained to evaluate the vascular anatomy. CONTRAST:  19mL OMNIPAQUE IOHEXOL 350 MG/ML SOLN COMPARISON:  Chest x-ray of the same date. FINDINGS: Cardiovascular: Heart size is normal without substantial pericardial effusion. Aortic caliber is normal with signs of aortic atherosclerosis. LEFT-sided Port-A-Cath in situ terminating at the upper portion of the RIGHT atrium. Central pulmonary vasculature with adequate opacification. Study is however limited by respiratory motion particularly in the lung bases. No central or lobar embolus. No upper lobe embolism. No gross embolism in the lower lobe though again with limited evaluation due to marked respiratory motion. Mediastinum/Nodes: Thoracic inlet structures are normal. No axillary or mediastinal lymphadenopathy. Fullness of the bilateral  hila in the setting of diffuse pneumonia. Lungs/Pleura: Multifocal airspace opacities with some evidence of subpleural sparing with the exception changes in the LEFT upper and mid lung. Airspace disease in this area contacts the pleural surface and is contiguous with an expansile bone lesion in the LEFT chest wall. Narrowing of central airways bilaterally with slit like appearance. Small bilateral pleural effusions RIGHT greater than LEFT. Upper Abdomen: Moderately large hiatal hernia. Imaged portions of liver, gallbladder, adrenal glands, pancreas, spleen and upper aspects of LEFT and RIGHT kidney are unremarkable. Musculoskeletal: Multifocal sclerotic metastatic disease all levels of the spine with involvement. Expansile sclerotic lesion involving the LEFT posterolateral fourth rib with adjacent lung disease and some stranding in the chest wall as described. This measures approximately 4.7 x 2.8 cm. Sclerosis of the sternum. Bilateral sclerosis in all visualized ribs. Findings that suggest pathologic fracture at the L1 level with changes of cement augmentation at T12. Review of the MIP images confirms the above findings. IMPRESSION: No signs of central or lobar level embolus with limited assessment of lung bases due to respiratory motion. No upper lobe emboli to the segmental level. Multifocal pneumonia in the chest perhaps superimposed on post radiation changes in the LEFT upper lobe. Correlate with any history of prior therapy as there is a large, destructive rib lesion with more confluent airspace disease in the adjacent lung. Based on pattern of disease would correlate with any history of COVID-19 infection or any drug therapy that could predispose this patient to pneumonitis. 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 Diffuse skeletal metastases with expansile sclerotic lesion involving the LEFT posterolateral fourth rib with adjacent lung disease as described. Findings that suggest  pathologic fracture at the L1 level with changes of cement augmentation at T12. Correlate with any  prior imaging if available, L1 is incompletely imaged. Would also correlate with any new symptoms in this area. Moderately large hiatal hernia. Aortic Atherosclerosis (ICD10-I70.0). Electronically Signed   By: Zetta Bills M.D.   On: 07/12/2021 10:37   DG CHEST PORT 1 VIEW  Result Date: 07/21/2021 CLINICAL DATA:  Respiratory distress EXAM: PORTABLE CHEST 1 VIEW COMPARISON:  July 19, 2021 FINDINGS: The mediastinal contour and cardiac silhouette are stable. Left central venous line is unchanged. Patchy consolidations identified in the bilateral upper and mid lung, to a lesser degree in the left lower lung, not significantly changed compared prior exam. Osseous expansion of the left fifth rib is unchanged. IMPRESSION: Patchy consolidations identified in the bilateral lungs, not significantly changed compared prior exam. Electronically Signed   By: Abelardo Diesel M.D.   On: 07/21/2021 08:29   DG Chest Port 1 View  Result Date: 07/17/2021 CLINICAL DATA:  Status post right thoracentesis. EXAM: PORTABLE CHEST 1 VIEW COMPARISON:  07/16/2021 FINDINGS: A left jugular Port-A-Cath remains in place with tip projecting over the high right atrium. Interstitial and hazy airspace opacities are again seen in both lungs with interval mildly improved aeration of the right upper lobe. There is an unchanged dense band of opacity in the left upper lobe. No large pleural effusion is evident on this AP image, with the effusions on the prior study being most visible on the lateral radiograph. No pneumothorax is identified. IMPRESSION: 1. No pneumothorax. 2. Bilateral pneumonia with mildly improved right upper lobe aeration. Electronically Signed   By: Logan Bores M.D.   On: 07/17/2021 14:21   US THORACENTESIS ASP PLEURAL SPACE W/IMG GUIDE  Result Date: 07/19/2021 INDICATION: Patient is metastatic lung cancer with left pleural  effusion. Request therapeutic thoracentesis. EXAM: ULTRASOUND GUIDED LEFT THORACENTESIS MEDICATIONS: 1% plain lidocaine, 1 mL COMPLICATIONS: None immediate. PROCEDURE: An ultrasound guided thoracentesis was thoroughly discussed with the patient and questions answered. The benefits, risks, alternatives and complications were also discussed. The patient understands and wishes to proceed with the procedure. Written consent was obtained. Ultrasound was performed to localize and mark an adequate pocket of fluid in the left chest. The area was then prepped and draped in the normal sterile fashion. 1% Lidocaine was used for local anesthesia. Under ultrasound guidance a 6 Fr Safe-T-Centesis catheter was introduced. Thoracentesis was performed. The catheter was removed and a dressing applied. FINDINGS: A total of approximately 400 mL of clear yellow fluid was removed. IMPRESSION: Successful ultrasound guided left thoracentesis yielding 400 mL of pleural fluid. Read by Ascencion Dike PA-C Electronically Signed   By: Aletta Edouard M.D.   On: 07/19/2021 16:54   US THORACENTESIS ASP PLEURAL SPACE W/IMG GUIDE  Result Date: 07/17/2021 INDICATION: Patient with a history of metastatic lung cancer presents today with bilateral pleural effusions. Interventional radiology asked to perform a therapeutic thoracentesis. EXAM: ULTRASOUND GUIDED THORACENTESIS MEDICATIONS: % lidocaine 10 mL COMPLICATIONS: None immediate. PROCEDURE: An ultrasound guided thoracentesis was thoroughly discussed with the patient and questions answered. The benefits, risks, alternatives and complications were also discussed. The patient understands and wishes to proceed with the procedure. Written consent was obtained. Ultrasound was performed to localize and mark an adequate pocket of fluid in the right chest. The area was then prepped and draped in the normal sterile fashion. 1% Lidocaine was used for local anesthesia. Under ultrasound guidance a 6 Fr  Safe-T-Centesis catheter was introduced. Thoracentesis was performed. The catheter was removed and a dressing applied. FINDINGS: A total of approximately 700  mL of yellow fluid was removed. IMPRESSION: Successful ultrasound guided right thoracentesis yielding 700 ml of pleural fluid. Read by: Soyla Dryer, NP Electronically Signed   By: Ruthann Cancer M.D.   On: 07/17/2021 13:56    Microbiology: Recent Results (from the past 240 hour(s))  Respiratory (~20 pathogens) panel by PCR     Status: None   Collection Time: 07/20/21  4:10 PM   Specimen: Nasopharyngeal Swab; Respiratory  Result Value Ref Range Status   Adenovirus NOT DETECTED NOT DETECTED Final   Coronavirus 229E NOT DETECTED NOT DETECTED Final    Comment: (NOTE) The Coronavirus on the Respiratory Panel, DOES NOT test for the novel  Coronavirus (2019 nCoV)    Coronavirus HKU1 NOT DETECTED NOT DETECTED Final   Coronavirus NL63 NOT DETECTED NOT DETECTED Final   Coronavirus OC43 NOT DETECTED NOT DETECTED Final   Metapneumovirus NOT DETECTED NOT DETECTED Final   Rhinovirus / Enterovirus NOT DETECTED NOT DETECTED Final   Influenza A NOT DETECTED NOT DETECTED Final   Influenza B NOT DETECTED NOT DETECTED Final   Parainfluenza Virus 1 NOT DETECTED NOT DETECTED Final   Parainfluenza Virus 2 NOT DETECTED NOT DETECTED Final   Parainfluenza Virus 3 NOT DETECTED NOT DETECTED Final   Parainfluenza Virus 4 NOT DETECTED NOT DETECTED Final   Respiratory Syncytial Virus NOT DETECTED NOT DETECTED Final   Bordetella pertussis NOT DETECTED NOT DETECTED Final   Bordetella Parapertussis NOT DETECTED NOT DETECTED Final   Chlamydophila pneumoniae NOT DETECTED NOT DETECTED Final   Mycoplasma pneumoniae NOT DETECTED NOT DETECTED Final    Comment: Performed at [facility], [address], [place], [place] [address]     Labs: Basic Metabolic Panel: Recent Labs  Lab 07/19/21 0512 07/20/21 0500 07/20/21 0750 07/21/21 0407 07/22/21 0430  07/23/21 0255  NA 137  --  140 137 138 136  K 3.3*  --  3.9 4.6 4.2 4.6  CL 102  --  105 100 99 100  CO2 29  --  28 27 30 31   GLUCOSE 121*  --  97 199* 178* 162*  BUN 23  --  18 21 29* 32*  CREATININE 0.93 0.92 1.03* 1.04* 1.28* 1.03*  CALCIUM 8.0*  --  8.4* 8.6* 9.1 8.9   Liver Function Tests: No results for input(s): AST, ALT, ALKPHOS, BILITOT, PROT, ALBUMIN in the last 168 hours. No results for input(s): LIPASE, AMYLASE in the last 168 hours. No results for input(s): AMMONIA in the last 168 hours. CBC: Recent Labs  Lab 07/18/21 0315 07/19/21 0512 07/21/21 0407 07/22/21 0430 07/23/21 0255  WBC 22.9* 14.1* 22.3* 26.8* 24.3*  NEUTROABS 21.4* 12.9* 21.4* 25.0* 22.5*  HGB 7.6* 7.3* 7.9* 8.0* 7.7*  HCT 24.7* 24.0* 25.7* 25.6* 25.2*  MCV 100.0 100.4* 100.4* 98.5 100.0  PLT 216 186 196 233 224   Cardiac Enzymes: No results for input(s): CKTOTAL, CKMB, CKMBINDEX, TROPONINI in the last 168 hours. BNP: BNP (last 3 results) No results for input(s): BNP in the last 8760 hours.  ProBNP (last 3 results) No results for input(s): PROBNP in the last 8760 hours.  CBG: Recent Labs  Lab 07/23/21 2031 07/24/21 0810 07/24/21 0854 07/24/21 1150 07/24/21 1627  GLUCAP 158* 57* 75 126* 143*       Signed:  Irine Seal MD.  [facility] 07/24/2021, 4:38 PM

## 2021-07-24 NOTE — Plan of Care (Signed)
  Problem: Clinical Measurements: Goal: Will remain free from infection Outcome: Progressing Goal: Diagnostic test results will improve Outcome: Progressing Goal: Respiratory complications will improve Outcome: Progressing   Problem: Coping: Goal: Level of anxiety will decrease Outcome: Progressing   Problem: Pain Managment: Goal: General experience of comfort will improve Outcome: Progressing

## 2021-11-10 DEATH — deceased

## 2022-11-28 IMAGING — DX DG CHEST 2V
2 series · 2 of 2 positions shown · non-contrast
Comparison: 07/12/2021.

CLINICAL DATA: Pneumonia 92K.K ([id number])

EXAM:
CHEST - 2 VIEW

[chest lat]
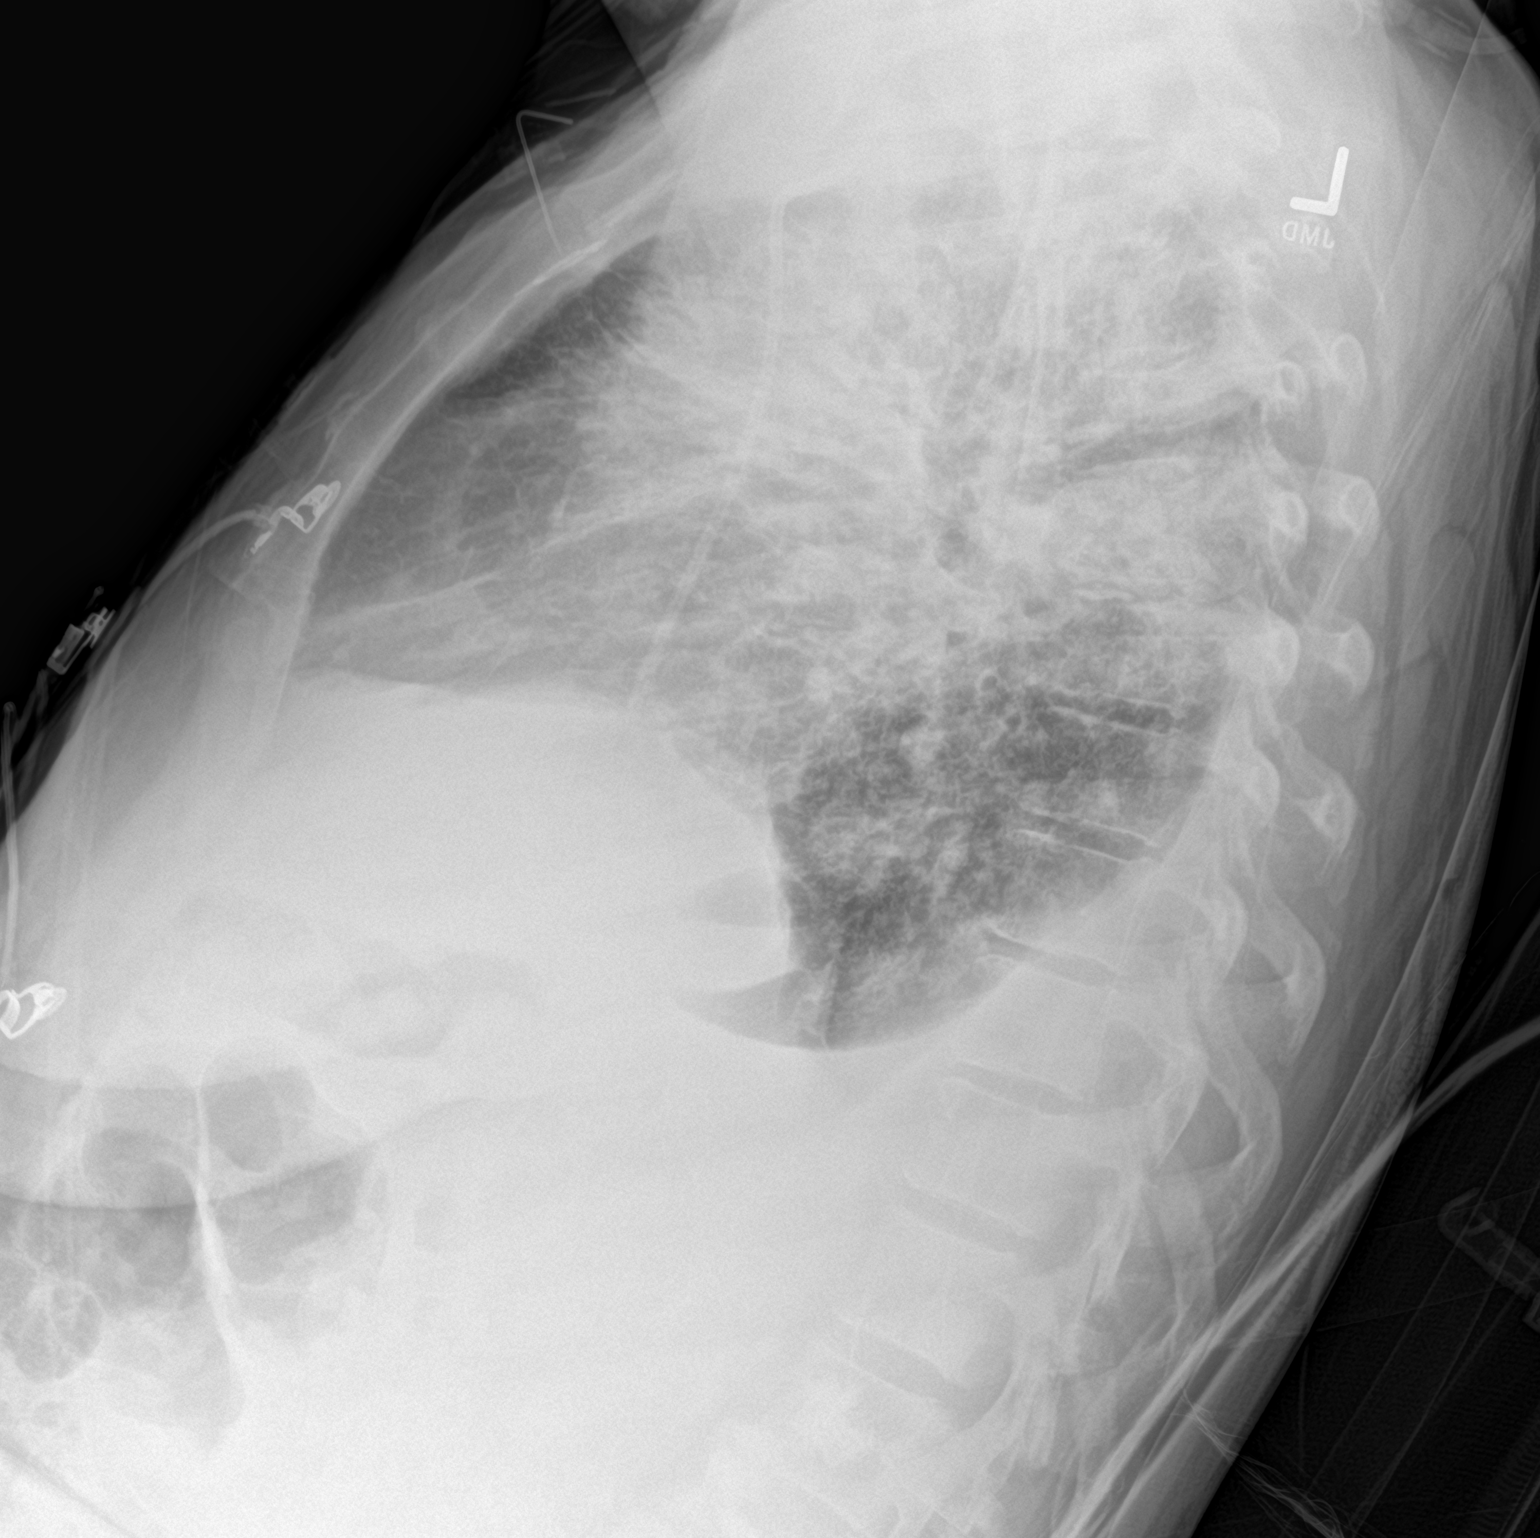

[chest ap]
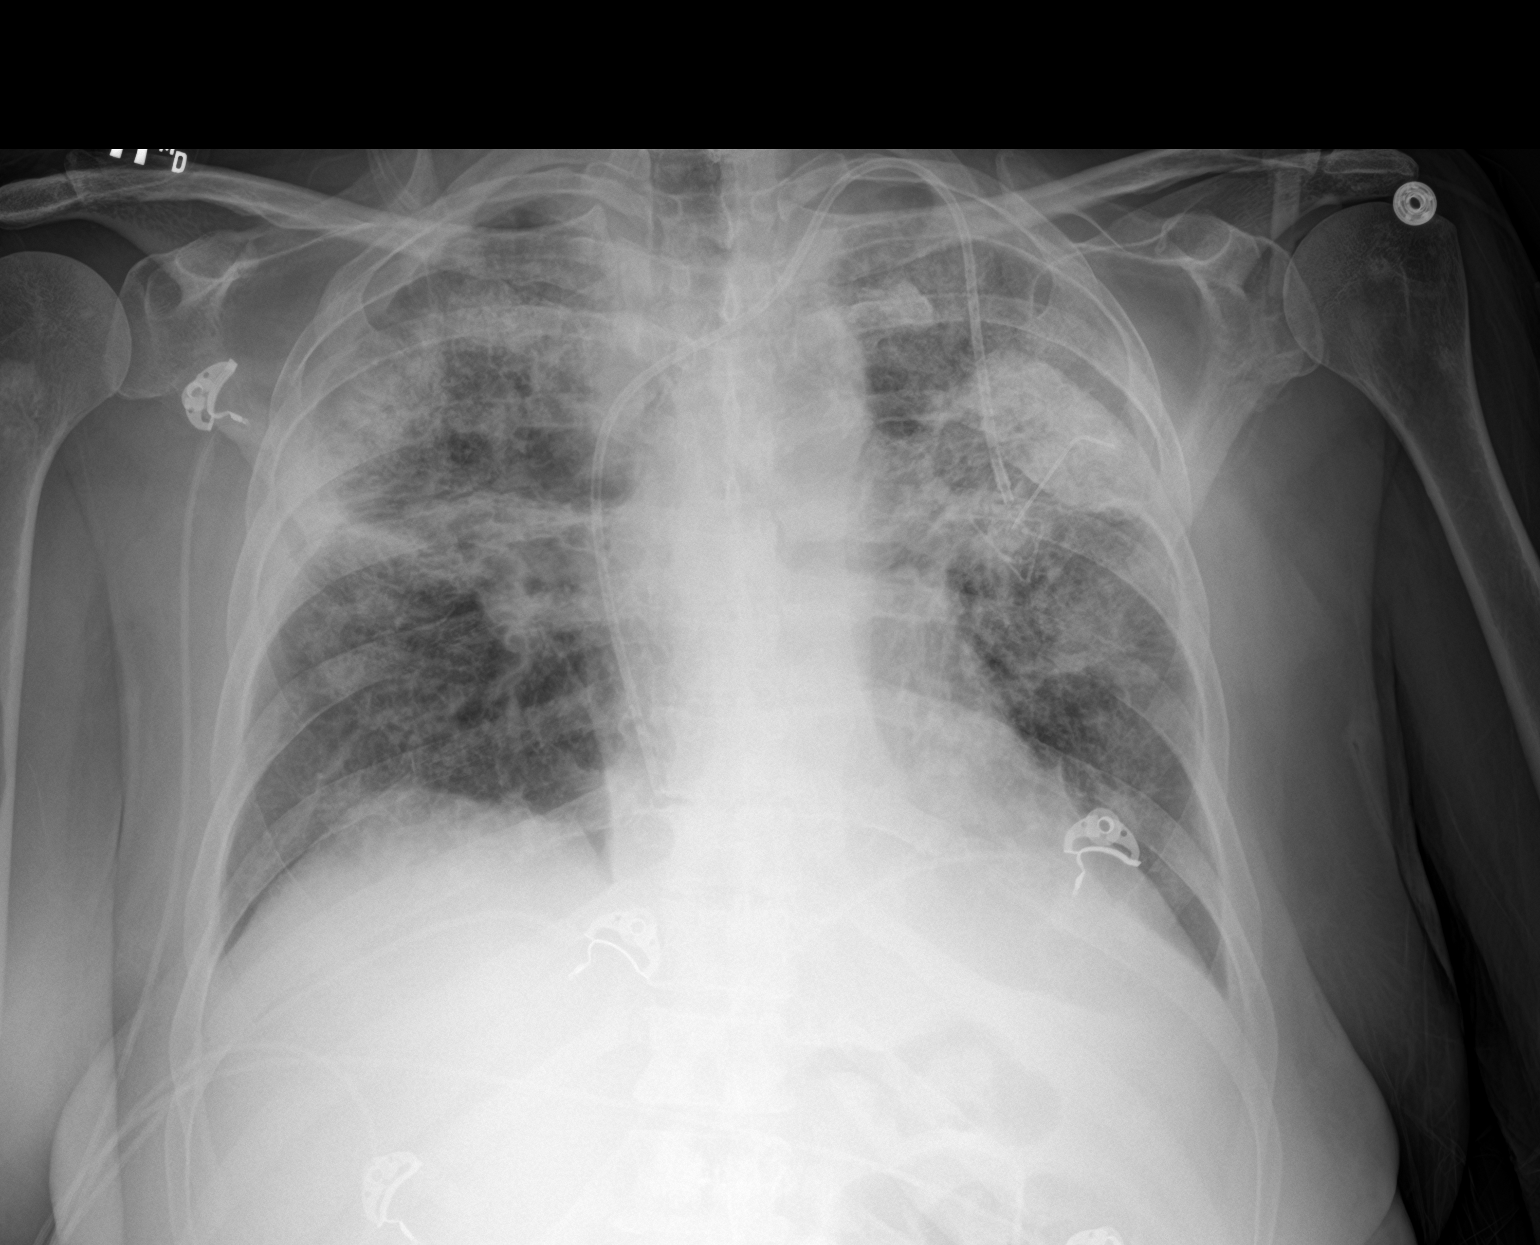

[2 of 2 positions shown; findings below may reference images not displayed]

FINDINGS: Mildly improved aeration in the lung bases with increased
interstitial and airspace opacities in the periphery of the upper
lungs.Similar dense consolidation in the lateral left upper lung.
Visible pneumothorax. Likely increased small to moderate bilateral
pleural effusions. Similar cardiomediastinal silhouette. Left IJ
approach Port-A-Cath with the tip projecting at the right atrium.
IMPRESSION: 1. Mildly improved aeration in the lung bases with increased
interstitial and airspace opacities in the periphery of the upper
lungs. Similar dense consolidation in the lateral left upper lung.
2. Likely increased small moderate bilateral pleural effusions.
3. Incompletely imaged L1 fracture. Destructive rib lesion better
seen on prior CT.

## 2022-11-29 IMAGING — DX DG CHEST 1V PORT
1 series · 1 of 1 positions shown · non-contrast
Comparison: 07/16/2021

CLINICAL DATA: Status post right thoracentesis.

EXAM:
PORTABLE CHEST 1 VIEW

[chest ap]
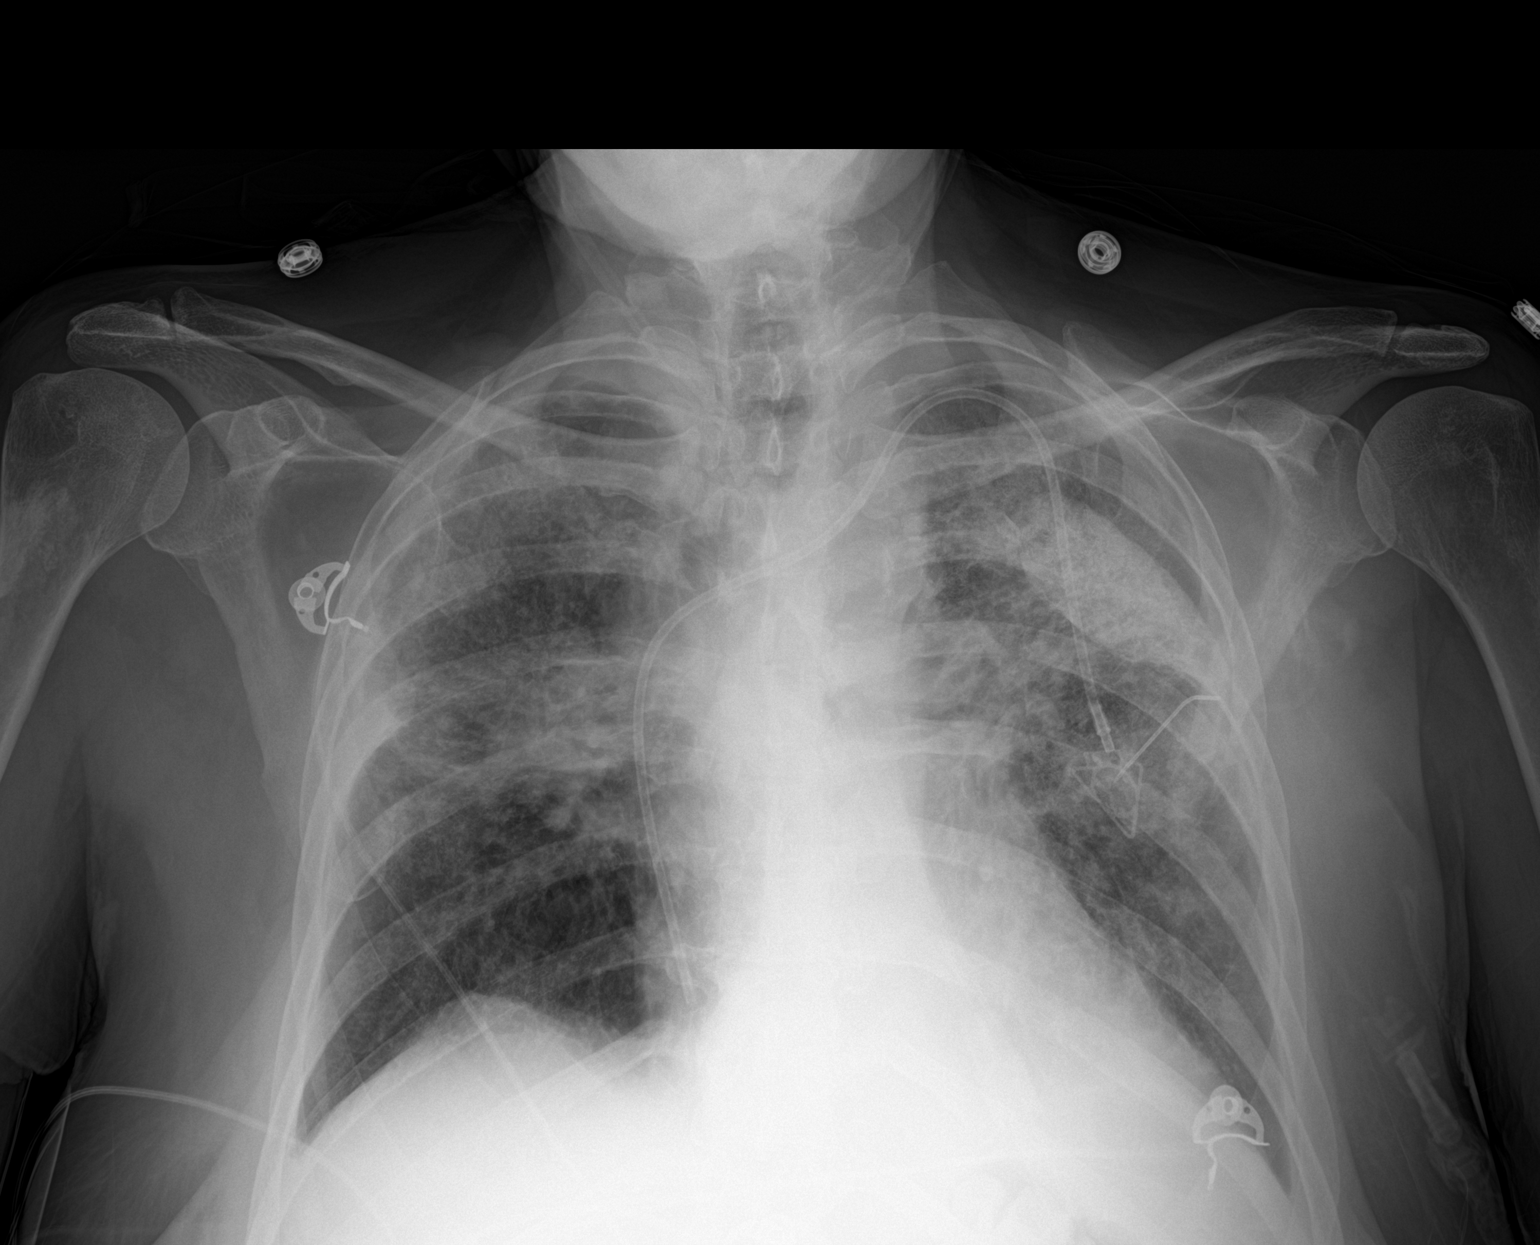

[1 of 1 positions shown; findings below may reference images not displayed]

FINDINGS: A left jugular Port-A-Cath remains in place with tip projecting over
the high right atrium. Interstitial and hazy airspace opacities are
again seen in both lungs with interval mildly improved aeration of
the right upper lobe. There is an unchanged dense band of opacity in
the left upper lobe. No large pleural effusion is evident on this AP
image, with the effusions on the prior study being most visible on
the lateral radiograph. No pneumothorax is identified.
IMPRESSION: 1. No pneumothorax.
2. Bilateral pneumonia with mildly improved right upper lobe
aeration.

## 2022-11-29 IMAGING — US US THORACENTESIS ASP PLEURAL SPACE W/IMG GUIDE
1 series · 6 of 6 positions shown · non-contrast
Comparison: none

INDICATION: Patient with a history of metastatic lung cancer presents today with
bilateral pleural effusions. Interventional radiology asked to
perform a therapeutic thoracentesis.

[Series 1: us thoracentesis asp pleural space w/img guide · 6 of 6 slices shown]
[im 1/6]
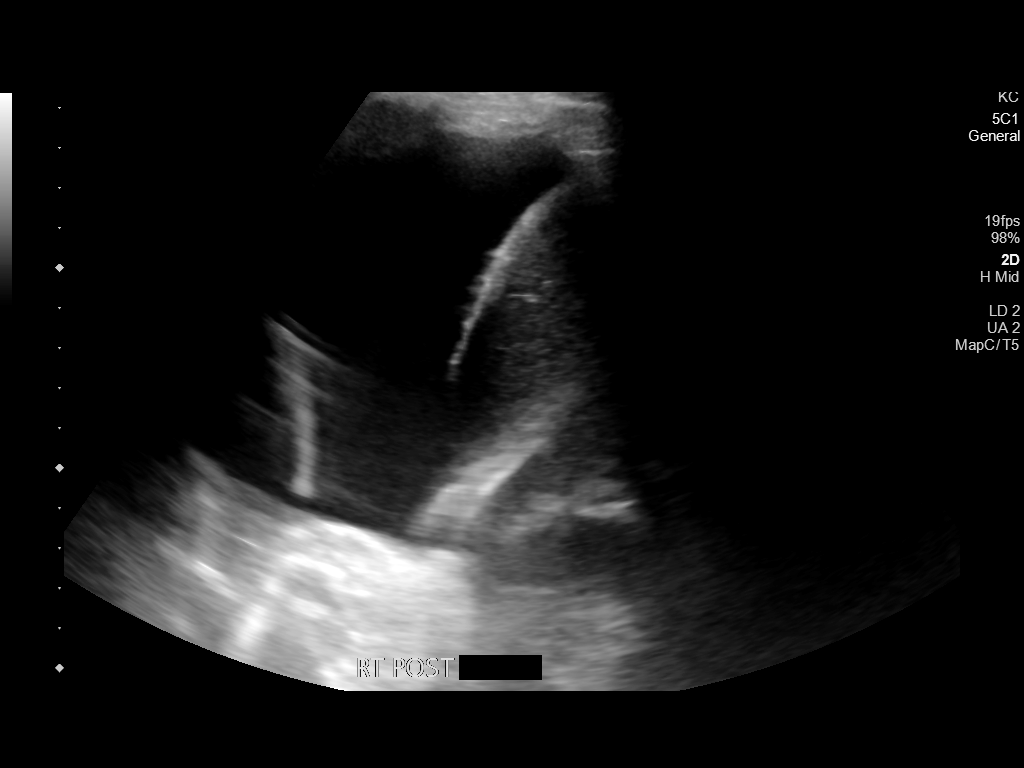
[im 2/6]
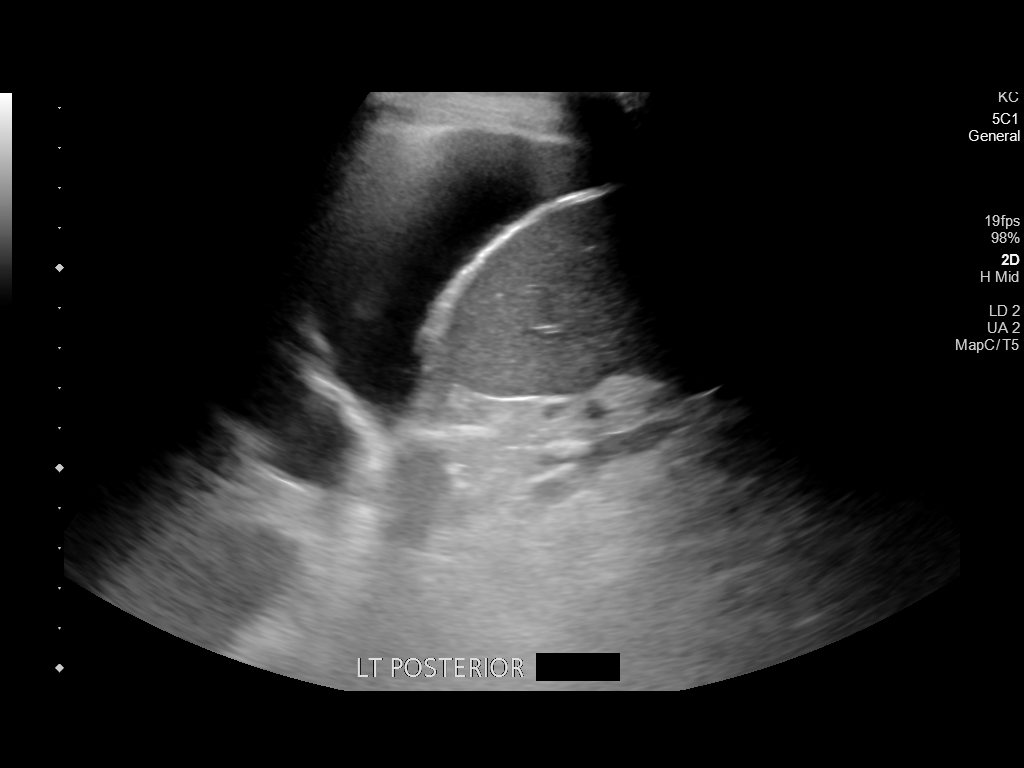
[im 3/6]
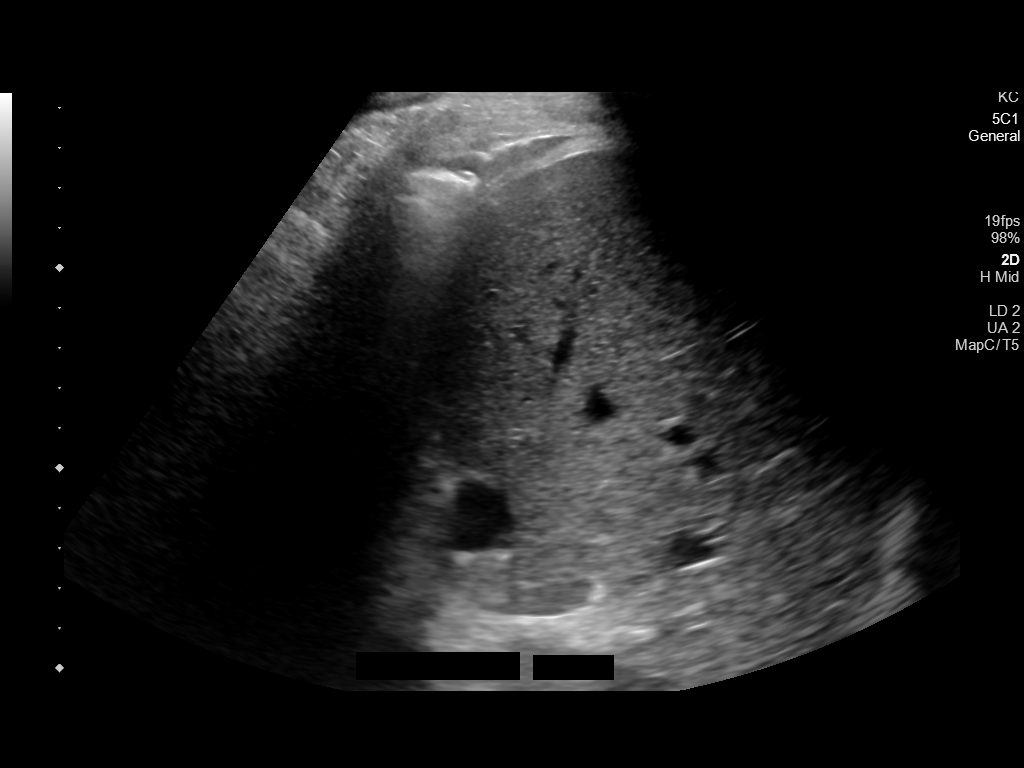
[im 4/6]
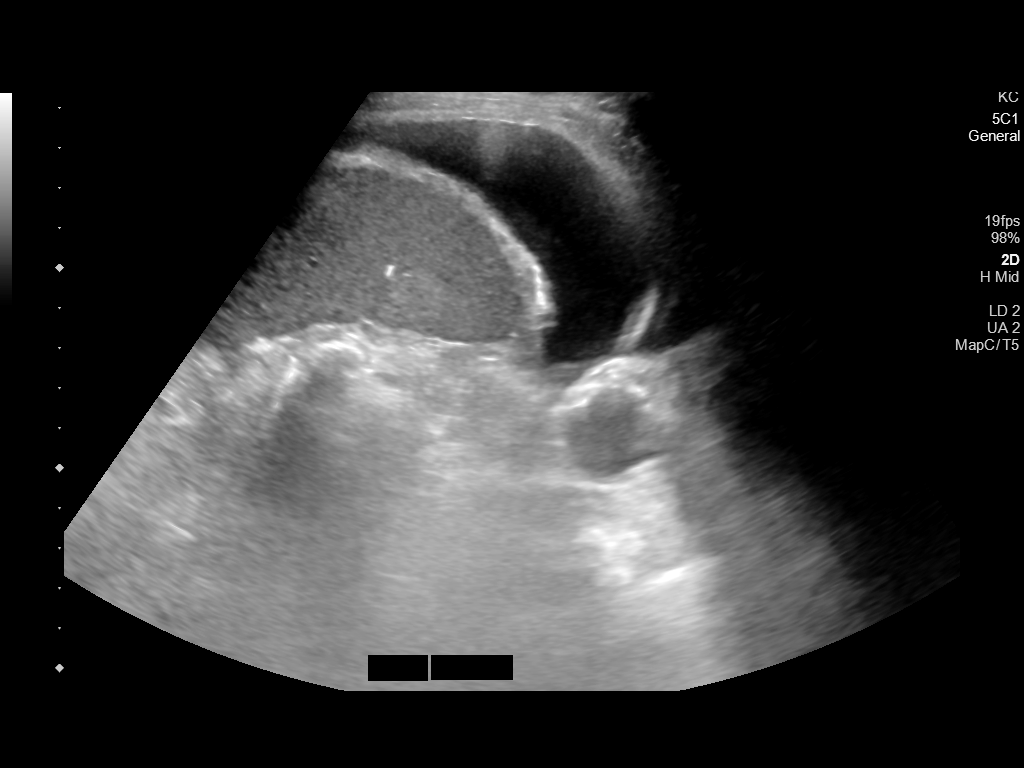
[im 5/6]
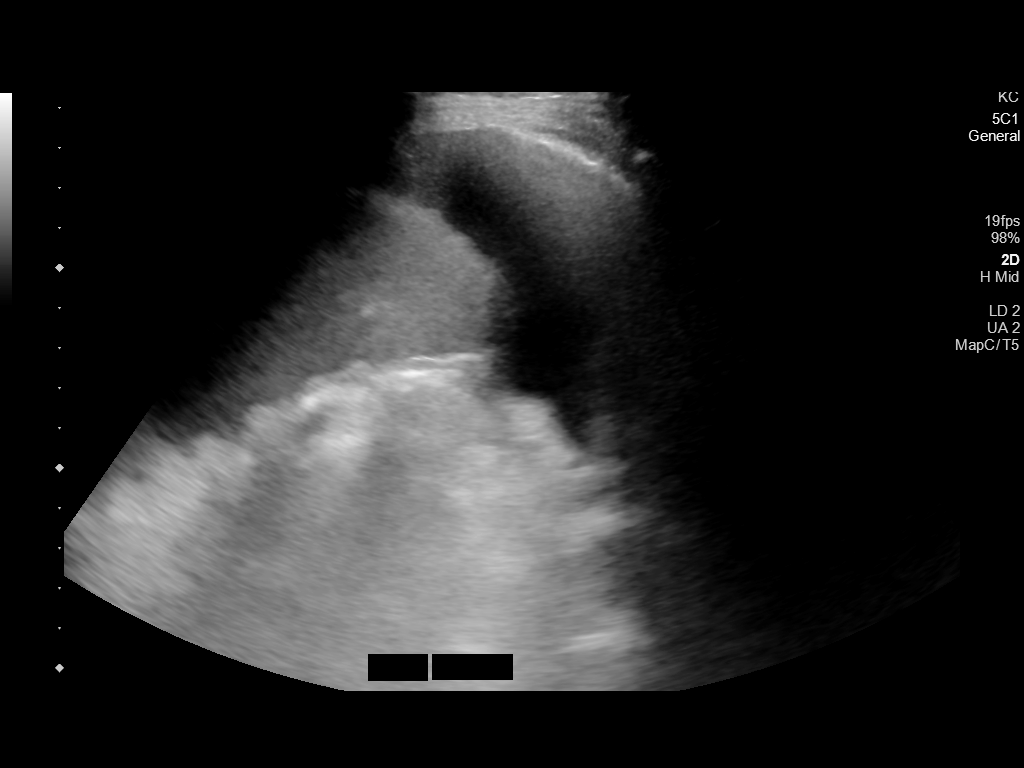
[im 6/6]
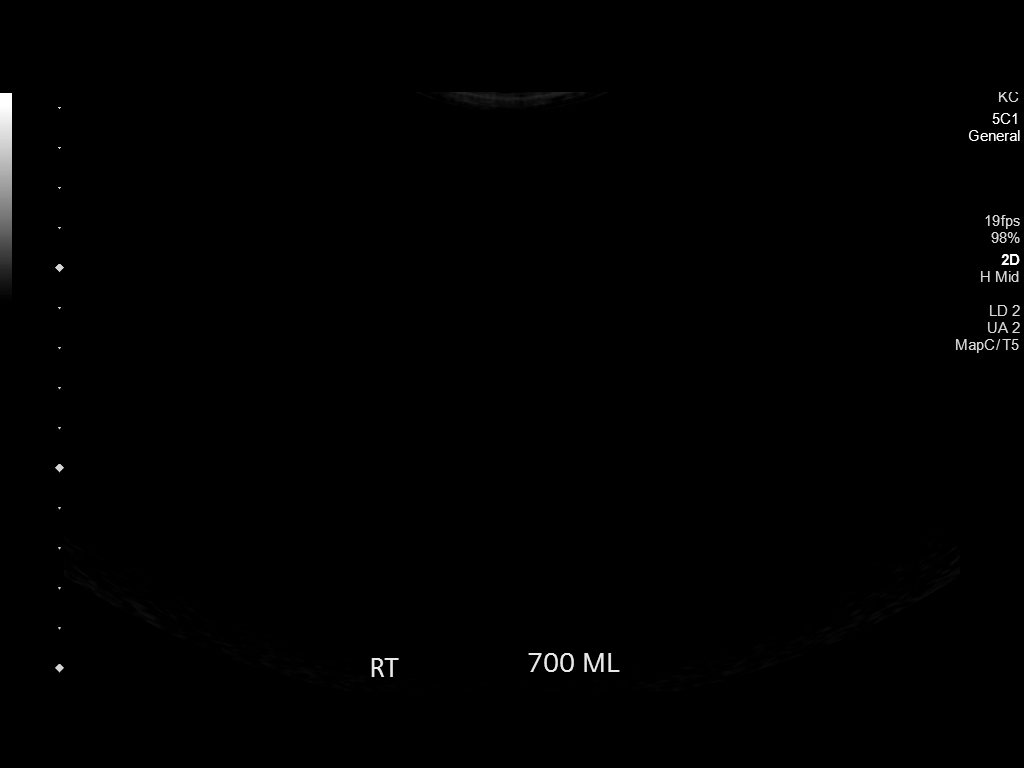

[6 of 6 positions shown; findings below may reference images not displayed]

EXAM:
ULTRASOUND GUIDED THORACENTESIS

MEDICATIONS:
% lidocaine 10 mL

COMPLICATIONS:
None immediate.

PROCEDURE:
An ultrasound guided thoracentesis was thoroughly discussed with the
patient and questions answered. The benefits, risks, alternatives
and complications were also discussed. The patient understands and
wishes to proceed with the procedure. Written consent was obtained.

Ultrasound was performed to localize and mark an adequate pocket of
fluid in the right chest. The area was then prepped and draped in
the normal sterile fashion. 1% Lidocaine was used for local
anesthesia. Under ultrasound guidance a 6 Fr Safe-T-Centesis
catheter was introduced. Thoracentesis was performed. The catheter
was removed and a dressing applied.
FINDINGS: A total of approximately 700 mL of yellow fluid was removed.
IMPRESSION: Successful ultrasound guided right thoracentesis yielding 700 ml of
pleural fluid. Read by: Nashary Karlen, NP

## 2022-12-01 IMAGING — US US THORACENTESIS ASP PLEURAL SPACE W/IMG GUIDE
1 series · 2 of 2 positions shown · non-contrast
Comparison: none

INDICATION: Patient is metastatic lung cancer with left pleural effusion.
Request therapeutic thoracentesis.

[Series 1: us thoracentesis asp pleural s mc & wl · 2 of 2 slices shown]
[im 1/2]
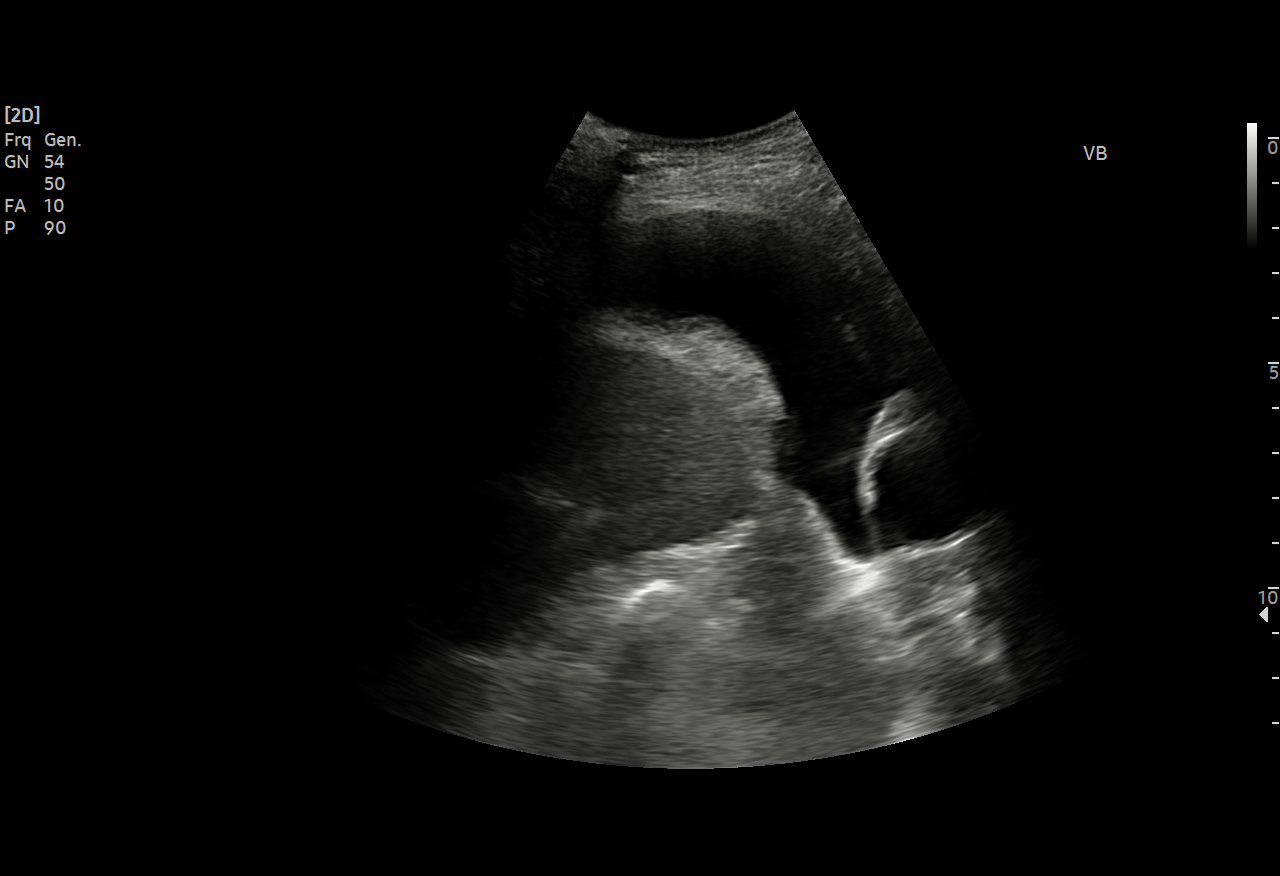
[im 2/2]
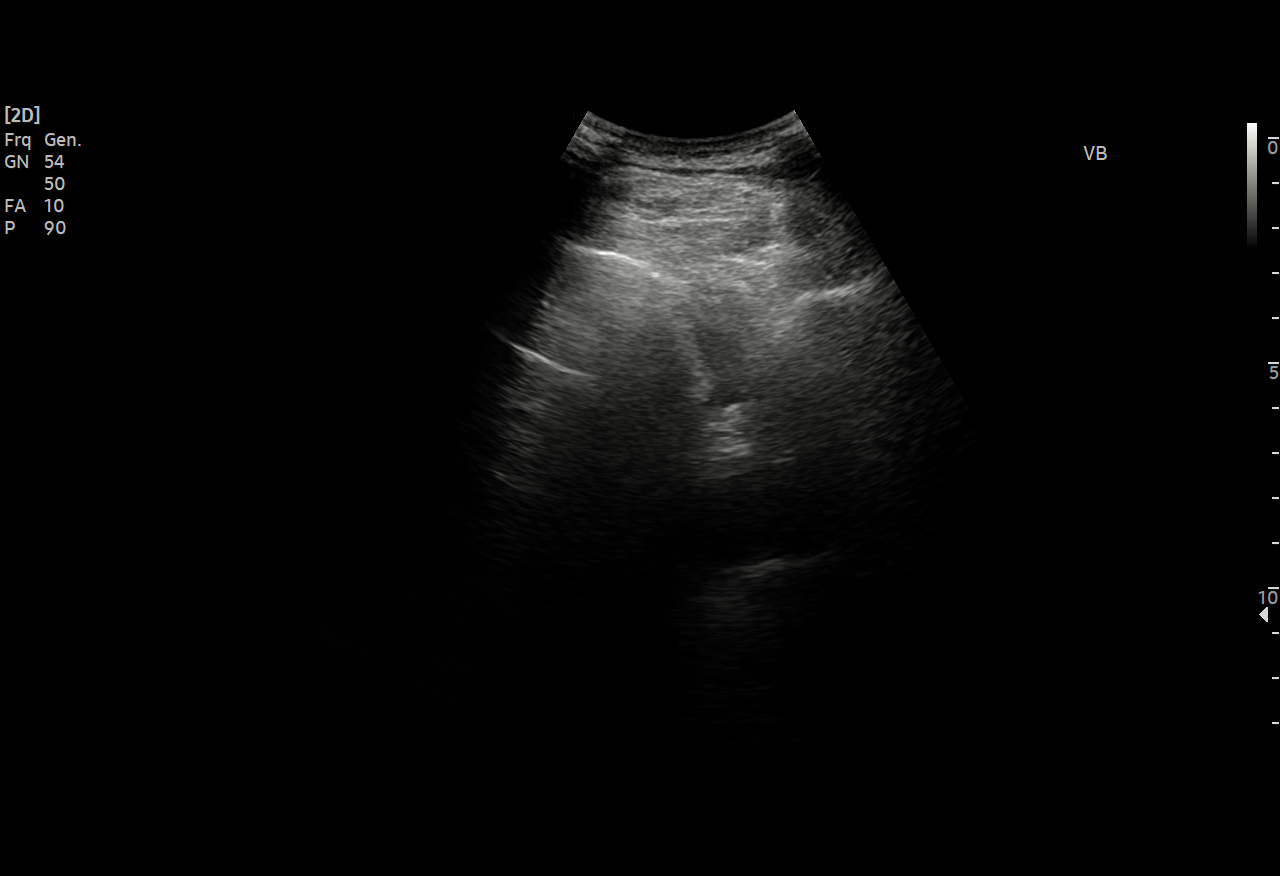

[2 of 2 positions shown; findings below may reference images not displayed]

EXAM:
ULTRASOUND GUIDED LEFT THORACENTESIS

MEDICATIONS:
1% plain lidocaine, 1 mL

COMPLICATIONS:
None immediate.

PROCEDURE:
An ultrasound guided thoracentesis was thoroughly discussed with the
patient and questions answered. The benefits, risks, alternatives
and complications were also discussed. The patient understands and
wishes to proceed with the procedure. Written consent was obtained.

Ultrasound was performed to localize and mark an adequate pocket of
fluid in the left chest. The area was then prepped and draped in the
normal sterile fashion. 1% Lidocaine was used for local anesthesia.
Under ultrasound guidance a 6 Fr Safe-T-Centesis catheter was
introduced. Thoracentesis was performed. The catheter was removed
and a dressing applied.
FINDINGS: A total of approximately 400 mL of clear yellow fluid was removed.
IMPRESSION: Successful ultrasound guided left thoracentesis yielding 400 mL of
pleural fluid.

Read by Rudi Jumper

## 2022-12-01 IMAGING — DX DG CHEST 1V
1 series · 1 of 1 positions shown · non-contrast
Comparison: Exam at 5886 hours compared to 07/17/2021

CLINICAL DATA: Post thoracentesis

EXAM:
CHEST  1 VIEW

[chest ap]
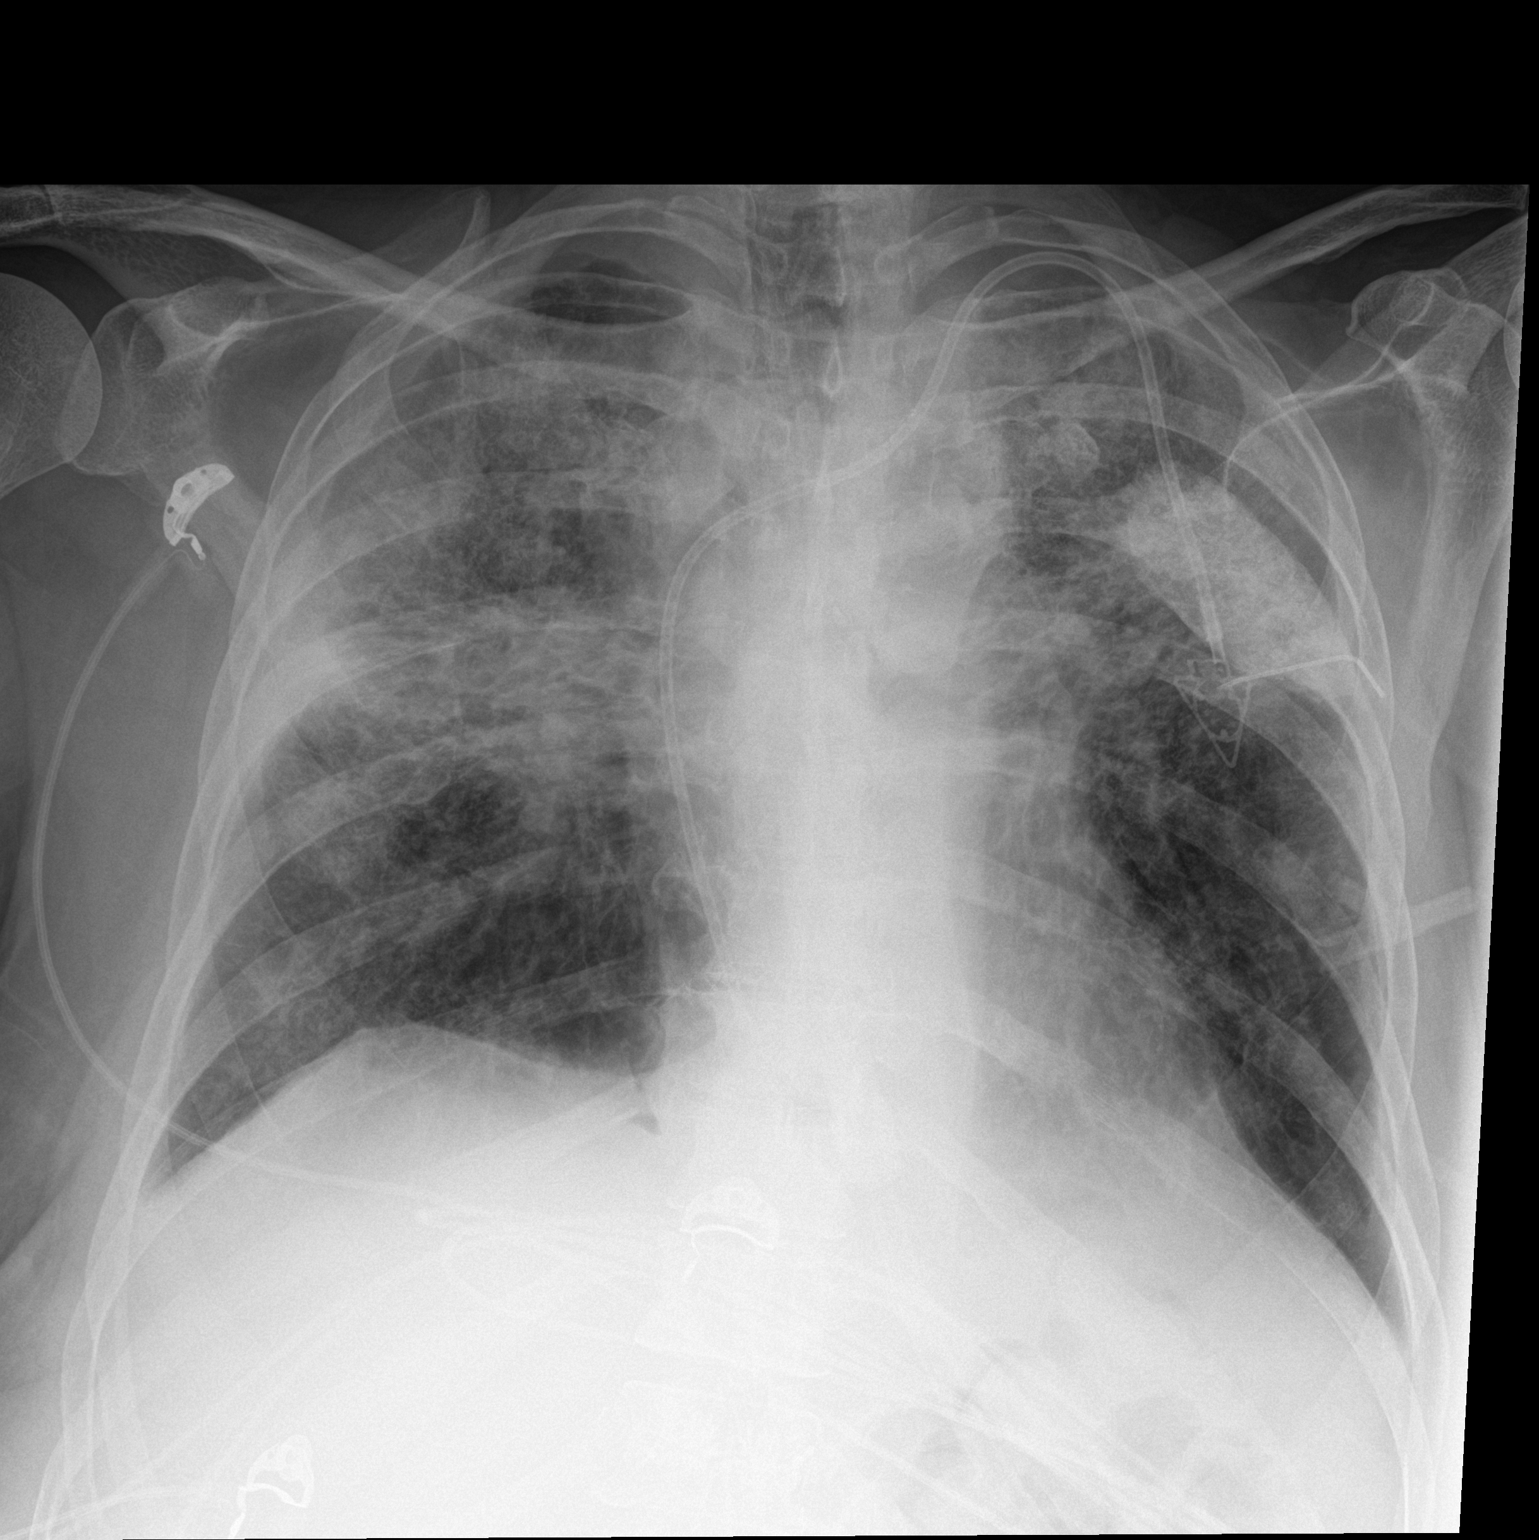

[1 of 1 positions shown; findings below may reference images not displayed]

FINDINGS: LEFT jugular Port-A-Cath with tip projecting over RIGHT atrium.

Normal heart size.

Extensive BILATERAL pulmonary infiltrates.

No definite pleural effusion or pneumothorax.

Chronic osseous expansion of the posterior LEFT fifth rib, patient
with known osseous metastatic disease.
IMPRESSION: Persistent pulmonary infiltrates.

No pneumothorax.
